# Patient Record
Sex: Male | Born: 1975 | Race: Black or African American | Hispanic: No | Marital: Married | Smoking: Former smoker
Health system: Southern US, Community
[De-identification: ages and names within clinical notes are randomized; demographics above are authoritative.]

## PROBLEM LIST (undated history)

## (undated) DIAGNOSIS — B20 Human immunodeficiency virus [HIV] disease: Secondary | ICD-10-CM

## (undated) DIAGNOSIS — Z9119 Patient's noncompliance with other medical treatment and regimen: Secondary | ICD-10-CM

## (undated) HISTORY — PX: OTHER SURGICAL HISTORY: SHX169

## (undated) HISTORY — DX: Human immunodeficiency virus (HIV) disease: B20

## (undated) HISTORY — DX: Patient's noncompliance with other medical treatment and regimen: Z91.19

---

## 2006-01-08 ENCOUNTER — Ambulatory Visit: Payer: Self-pay | Admitting: Internal Medicine

## 2006-01-08 LAB — CONVERTED CEMR LAB: Hep A Total Ab: POSITIVE

## 2006-01-23 ENCOUNTER — Ambulatory Visit: Payer: Self-pay | Admitting: Internal Medicine

## 2006-01-27 ENCOUNTER — Ambulatory Visit (HOSPITAL_COMMUNITY): Admission: RE | Admit: 2006-01-27 | Discharge: 2006-01-27 | Payer: Self-pay | Admitting: Internal Medicine

## 2006-02-13 ENCOUNTER — Ambulatory Visit: Payer: Self-pay | Admitting: Internal Medicine

## 2006-02-14 ENCOUNTER — Ambulatory Visit: Payer: Self-pay | Admitting: *Deleted

## 2006-02-27 ENCOUNTER — Ambulatory Visit (HOSPITAL_COMMUNITY): Admission: RE | Admit: 2006-02-27 | Discharge: 2006-02-27 | Payer: Self-pay | Admitting: Internal Medicine

## 2006-02-27 ENCOUNTER — Encounter (INDEPENDENT_AMBULATORY_CARE_PROVIDER_SITE_OTHER): Payer: Self-pay | Admitting: Specialist

## 2006-06-16 ENCOUNTER — Ambulatory Visit: Payer: Self-pay | Admitting: Internal Medicine

## 2006-07-07 ENCOUNTER — Ambulatory Visit: Payer: Self-pay | Admitting: Internal Medicine

## 2006-08-28 ENCOUNTER — Ambulatory Visit: Payer: Self-pay | Admitting: Internal Medicine

## 2006-10-02 ENCOUNTER — Ambulatory Visit: Payer: Self-pay | Admitting: Internal Medicine

## 2007-03-05 DIAGNOSIS — B20 Human immunodeficiency virus [HIV] disease: Secondary | ICD-10-CM

## 2007-05-06 ENCOUNTER — Encounter (INDEPENDENT_AMBULATORY_CARE_PROVIDER_SITE_OTHER): Payer: Self-pay | Admitting: *Deleted

## 2007-10-29 ENCOUNTER — Ambulatory Visit: Payer: Self-pay | Admitting: Internal Medicine

## 2007-10-29 LAB — CONVERTED CEMR LAB
ALT: 16 units/L (ref 0–53)
Absolute CD4: 169 #/uL — ABNORMAL LOW (ref 381–1469)
BUN: 10 mg/dL (ref 6–23)
Basophils Relative: 1 % (ref 0–1)
CD4 T Helper %: 14 % — ABNORMAL LOW (ref 32–62)
Calcium: 8.6 mg/dL (ref 8.4–10.5)
Chloride: 105 meq/L (ref 96–112)
Eosinophils Relative: 8 % — ABNORMAL HIGH (ref 0–5)
Hemoglobin: 13.8 g/dL (ref 13.0–17.0)
Lymphocytes Relative: 28 % (ref 12–46)
MCHC: 32.5 g/dL (ref 30.0–36.0)
Monocytes Absolute: 0.5 10*3/uL (ref 0.1–1.0)
Monocytes Relative: 12 % (ref 3–12)
Neutro Abs: 2.3 10*3/uL (ref 1.7–7.7)
Total Bilirubin: 0.2 mg/dL — ABNORMAL LOW (ref 0.3–1.2)
Total Protein: 9.1 g/dL — ABNORMAL HIGH (ref 6.0–8.3)
Total lymphocyte count: 1204 cells/mcL (ref 700–3300)
WBC, lymph enumeration: 4.3 10*3/uL (ref 4.0–10.5)
WBC: 4.3 10*3/uL (ref 4.0–10.5)

## 2008-03-07 ENCOUNTER — Ambulatory Visit: Payer: Self-pay | Admitting: Internal Medicine

## 2008-04-11 ENCOUNTER — Ambulatory Visit: Payer: Self-pay | Admitting: Internal Medicine

## 2008-04-11 LAB — CONVERTED CEMR LAB
ALT: 17 units/L (ref 0–53)
Absolute CD4: 91 #/uL — ABNORMAL LOW (ref 381–1469)
Albumin: 3.9 g/dL (ref 3.5–5.2)
Alkaline Phosphatase: 71 units/L (ref 39–117)
BUN: 14 mg/dL (ref 6–23)
CD4 T Helper %: 14 % — ABNORMAL LOW (ref 32–62)
Calcium: 8.6 mg/dL (ref 8.4–10.5)
Eosinophils Absolute: 0.3 10*3/uL (ref 0.0–0.7)
Eosinophils Relative: 9 % — ABNORMAL HIGH (ref 0–5)
Glucose, Bld: 96 mg/dL (ref 70–99)
Lymphs Abs: 0.7 10*3/uL (ref 0.7–4.0)
MCV: 84 fL (ref 78.0–100.0)
Monocytes Absolute: 0.5 10*3/uL (ref 0.1–1.0)
Monocytes Relative: 17 % — ABNORMAL HIGH (ref 3–12)
Total Bilirubin: 0.3 mg/dL (ref 0.3–1.2)
Total lymphocyte count: 651 cells/mcL — ABNORMAL LOW (ref 700–3300)
WBC, lymph enumeration: 3.1 10*3/uL — ABNORMAL LOW (ref 4.0–10.5)
WBC: 3.1 10*3/uL — ABNORMAL LOW (ref 4.0–10.5)

## 2008-04-14 ENCOUNTER — Encounter: Payer: Self-pay | Admitting: Internal Medicine

## 2008-04-14 LAB — CONVERTED CEMR LAB

## 2008-05-05 ENCOUNTER — Ambulatory Visit: Payer: Self-pay | Admitting: Internal Medicine

## 2008-06-16 ENCOUNTER — Ambulatory Visit: Payer: Self-pay | Admitting: Internal Medicine

## 2008-06-16 LAB — CONVERTED CEMR LAB
AST: 16 units/L (ref 0–37)
Absolute CD4: 99 #/uL — ABNORMAL LOW (ref 381–1469)
Albumin: 4.1 g/dL (ref 3.5–5.2)
BUN: 11 mg/dL (ref 6–23)
Basophils Absolute: 0 10*3/uL (ref 0.0–0.1)
Basophils Relative: 1 % (ref 0–1)
CD4 T Helper %: 10 % — ABNORMAL LOW (ref 32–62)
Calcium: 9.4 mg/dL (ref 8.4–10.5)
Eosinophils Absolute: 0.5 10*3/uL (ref 0.0–0.7)
Eosinophils Relative: 11 % — ABNORMAL HIGH (ref 0–5)
GC Probe Amp, Urine: NEGATIVE
HIV 1 RNA Quant: 6200 copies/mL — ABNORMAL HIGH (ref <50)
Hemoglobin: 13.4 g/dL (ref 13.0–17.0)
Lymphocytes Relative: 23 % (ref 12–46)
MCHC: 33.2 g/dL (ref 30.0–36.0)
Neutro Abs: 2.4 10*3/uL (ref 1.7–7.7)
Potassium: 4 meq/L (ref 3.5–5.3)
RBC: 4.52 M/uL (ref 4.22–5.81)
Sodium: 137 meq/L (ref 135–145)
Total Bilirubin: 0.5 mg/dL (ref 0.3–1.2)

## 2008-07-07 ENCOUNTER — Ambulatory Visit: Payer: Self-pay | Admitting: Internal Medicine

## 2008-08-04 ENCOUNTER — Encounter: Payer: Self-pay | Admitting: Internal Medicine

## 2008-08-04 ENCOUNTER — Ambulatory Visit: Payer: Self-pay | Admitting: Internal Medicine

## 2008-08-04 LAB — CONVERTED CEMR LAB
ALT: 13 units/L (ref 0–53)
AST: 18 units/L (ref 0–37)
Absolute CD4: 80 #/uL — ABNORMAL LOW (ref 381–1469)
Alkaline Phosphatase: 66 units/L (ref 39–117)
CD4 T Helper %: 12 % — ABNORMAL LOW (ref 32–62)
CO2: 24 meq/L (ref 19–32)
Calcium: 9.3 mg/dL (ref 8.4–10.5)
Glucose, Bld: 95 mg/dL (ref 70–99)
HCT: 40.8 % (ref 39.0–52.0)
HIV 1 RNA Quant: 4950 copies/mL — ABNORMAL HIGH (ref <48)
Lymphs Abs: 0.7 10*3/uL (ref 0.7–4.0)
MCV: 89.1 fL (ref 78.0–100.0)
Monocytes Absolute: 0.4 10*3/uL (ref 0.1–1.0)
Platelets: 203 10*3/uL (ref 150–400)
RBC: 4.58 M/uL (ref 4.22–5.81)
Sodium: 137 meq/L (ref 135–145)
Total Bilirubin: 0.4 mg/dL (ref 0.3–1.2)
WBC, lymph enumeration: 3.9 10*3/uL — ABNORMAL LOW (ref 4.0–10.5)
WBC: 3.9 10*3/uL — ABNORMAL LOW (ref 4.0–10.5)

## 2008-09-08 ENCOUNTER — Ambulatory Visit: Payer: Self-pay | Admitting: Internal Medicine

## 2008-09-09 ENCOUNTER — Encounter: Payer: Self-pay | Admitting: Internal Medicine

## 2008-09-09 LAB — CONVERTED CEMR LAB
CD4 T Helper %: 15 % — ABNORMAL LOW (ref 32–62)
HIV-1 RNA Quant, Log: 4.79 — ABNORMAL HIGH (ref <1.68)
WBC, lymph enumeration: 3.3 10*3/uL — ABNORMAL LOW (ref 4.0–10.5)

## 2008-09-26 ENCOUNTER — Encounter: Payer: Self-pay | Admitting: Internal Medicine

## 2008-09-26 ENCOUNTER — Ambulatory Visit: Payer: Self-pay | Admitting: Internal Medicine

## 2008-11-07 ENCOUNTER — Ambulatory Visit: Payer: Self-pay | Admitting: Internal Medicine

## 2008-11-07 LAB — CONVERTED CEMR LAB
ALT: 31 units/L (ref 0–53)
AST: 112 units/L — ABNORMAL HIGH (ref 0–37)
Albumin: 4 g/dL (ref 3.5–5.2)
BUN: 14 mg/dL (ref 6–23)
Basophils Absolute: 0 10*3/uL (ref 0.0–0.1)
CD4 T Helper %: 13 % — ABNORMAL LOW (ref 32–62)
CO2: 23 meq/L (ref 19–32)
Calcium: 9.2 mg/dL (ref 8.4–10.5)
Eosinophils Relative: 8 % — ABNORMAL HIGH (ref 0–5)
Glucose, Bld: 86 mg/dL (ref 70–99)
HCT: 37.6 % — ABNORMAL LOW (ref 39.0–52.0)
HIV-1 RNA Quant, Log: 2.32 — ABNORMAL HIGH (ref <1.68)
Lymphocytes Relative: 19 % (ref 12–46)
MCV: 86.4 fL (ref 78.0–100.0)
Monocytes Absolute: 0.4 10*3/uL (ref 0.1–1.0)
Neutrophils Relative %: 65 % (ref 43–77)
Platelets: 198 10*3/uL (ref 150–400)
Potassium: 3.8 meq/L (ref 3.5–5.3)
Total Protein: 8 g/dL (ref 6.0–8.3)
WBC: 4.6 10*3/uL (ref 4.0–10.5)

## 2008-11-21 ENCOUNTER — Encounter: Payer: Self-pay | Admitting: Internal Medicine

## 2008-11-21 ENCOUNTER — Ambulatory Visit: Payer: Self-pay | Admitting: Internal Medicine

## 2008-11-25 ENCOUNTER — Telehealth: Payer: Self-pay | Admitting: Internal Medicine

## 2008-12-06 ENCOUNTER — Encounter: Payer: Self-pay | Admitting: Internal Medicine

## 2008-12-23 ENCOUNTER — Telehealth: Payer: Self-pay | Admitting: Internal Medicine

## 2009-03-30 ENCOUNTER — Encounter: Payer: Self-pay | Admitting: Internal Medicine

## 2009-03-30 ENCOUNTER — Ambulatory Visit: Payer: Self-pay | Admitting: Internal Medicine

## 2009-03-30 LAB — CONVERTED CEMR LAB
ALT: 10 units/L (ref 0–53)
AST: 21 units/L (ref 0–37)
Absolute CD4: 141 #/uL — ABNORMAL LOW (ref 381–1469)
Albumin: 4.5 g/dL (ref 3.5–5.2)
BUN: 10 mg/dL (ref 6–23)
Basophils Absolute: 0 10*3/uL (ref 0.0–0.1)
Basophils Relative: 1 % (ref 0–1)
CD4 T Helper %: 13 % — ABNORMAL LOW (ref 32–62)
Eosinophils Relative: 10 % — ABNORMAL HIGH (ref 0–5)
Glucose, Bld: 81 mg/dL (ref 70–99)
HCT: 40.8 % (ref 39.0–52.0)
HIV 1 RNA Quant: <48 copies/mL (ref <48)
Lymphs Abs: 1.1 10*3/uL (ref 0.7–4.0)
MCHC: 33.3 g/dL (ref 30.0–36.0)
Monocytes Absolute: 0.4 10*3/uL (ref 0.1–1.0)
Neutrophils Relative %: 59 % (ref 43–77)
Potassium: 3.7 meq/L (ref 3.5–5.3)
RBC: 4.69 M/uL (ref 4.22–5.81)
RDW: 12.4 % (ref 11.5–15.5)
Sodium: 136 meq/L (ref 135–145)
Total Protein: 8 g/dL (ref 6.0–8.3)
Total lymphocyte count: 1081 cells/mcL (ref 700–3300)

## 2009-04-17 ENCOUNTER — Telehealth: Payer: Self-pay | Admitting: Internal Medicine

## 2009-04-20 ENCOUNTER — Encounter: Payer: Self-pay | Admitting: Internal Medicine

## 2009-04-20 ENCOUNTER — Ambulatory Visit: Payer: Self-pay | Admitting: Internal Medicine

## 2009-05-16 ENCOUNTER — Telehealth: Payer: Self-pay | Admitting: Internal Medicine

## 2009-07-24 ENCOUNTER — Encounter: Payer: Self-pay | Admitting: Internal Medicine

## 2009-07-24 ENCOUNTER — Ambulatory Visit: Payer: Self-pay | Admitting: Internal Medicine

## 2009-07-24 LAB — CONVERTED CEMR LAB
Absolute CD4: 173 #/uL — ABNORMAL LOW (ref 381–1469)
Albumin: 4.4 g/dL (ref 3.5–5.2)
Alkaline Phosphatase: 61 units/L (ref 39–117)
CO2: 24 meq/L (ref 19–32)
HIV 1 RNA Quant: 66 copies/mL — ABNORMAL HIGH (ref <48)
Hemoglobin: 13.1 g/dL (ref 13.0–17.0)
MCHC: 33.3 g/dL (ref 30.0–36.0)
Neutro Abs: 2.3 10*3/uL (ref 1.7–7.7)
Neutrophils Relative %: 61 % (ref 43–77)
Platelets: 256 10*3/uL (ref 150–400)
Sodium: 139 meq/L (ref 135–145)
Total lymphocyte count: 962 cells/mcL (ref 700–3300)

## 2009-08-24 ENCOUNTER — Encounter: Payer: Self-pay | Admitting: Internal Medicine

## 2009-08-24 ENCOUNTER — Ambulatory Visit: Payer: Self-pay | Admitting: Internal Medicine

## 2009-11-02 ENCOUNTER — Telehealth: Payer: Self-pay | Admitting: Internal Medicine

## 2009-11-15 ENCOUNTER — Telehealth: Payer: Self-pay | Admitting: Internal Medicine

## 2009-11-23 ENCOUNTER — Ambulatory Visit: Payer: Self-pay | Admitting: Internal Medicine

## 2009-11-23 LAB — CONVERTED CEMR LAB
ALT: 12 U/L
AST: 18 U/L
Albumin: 4.1 g/dL
Alkaline Phosphatase: 59 U/L
BUN: 4 mg/dL — ABNORMAL LOW
Basophils Absolute: 0 10*3/uL
Basophils Relative: 1 %
CO2: 22 meq/L
Calcium: 8.5 mg/dL
Chloride: 106 meq/L
Cholesterol: 119 mg/dL
Creatinine, Ser: 0.83 mg/dL
Eosinophils Absolute: 0.4 10*3/uL
Eosinophils Relative: 5 %
Glucose, Bld: 81 mg/dL
HCT: 35.9 % — ABNORMAL LOW
HDL: 34 mg/dL — ABNORMAL LOW
HIV 1 RNA Quant: 86 {copies}/mL — ABNORMAL HIGH
HIV-1 RNA Quant, Log: 1.93 — ABNORMAL HIGH
Hemoglobin: 11.9 g/dL — ABNORMAL LOW
LDL Cholesterol: 75 mg/dL
Lymphocytes Relative: 18 %
Lymphs Abs: 1.3 10*3/uL
MCHC: 33.1 g/dL
MCV: 88 fL
Monocytes Absolute: 0.4 10*3/uL
Monocytes Relative: 5 %
Neutro Abs: 5 10*3/uL
Neutrophils Relative %: 71 %
Platelets: 230 10*3/uL
Potassium: 3.7 meq/L
RBC: 4.08 M/uL — ABNORMAL LOW
RDW: 11.8 %
Sodium: 139 meq/L
Total Bilirubin: 0.5 mg/dL
Total CHOL/HDL Ratio: 3.5
Total Protein: 7.1 g/dL
Triglycerides: 50 mg/dL
VLDL: 10 mg/dL
WBC: 7 10*3/uL

## 2009-11-30 ENCOUNTER — Telehealth (INDEPENDENT_AMBULATORY_CARE_PROVIDER_SITE_OTHER): Payer: Self-pay | Admitting: *Deleted

## 2009-12-22 ENCOUNTER — Telehealth: Payer: Self-pay | Admitting: Internal Medicine

## 2010-03-14 ENCOUNTER — Encounter: Payer: Self-pay | Admitting: Internal Medicine

## 2010-04-18 ENCOUNTER — Encounter: Payer: Self-pay | Admitting: Internal Medicine

## 2010-04-26 ENCOUNTER — Ambulatory Visit: Payer: Self-pay | Admitting: Internal Medicine

## 2010-04-26 LAB — CONVERTED CEMR LAB
AST: 18 units/L (ref 0–37)
Alkaline Phosphatase: 59 units/L (ref 39–117)
Basophils Absolute: 0 10*3/uL (ref 0.0–0.1)
Creatinine, Ser: 1.05 mg/dL (ref 0.40–1.50)
Eosinophils Relative: 4 % (ref 0–5)
Glucose, Bld: 98 mg/dL (ref 70–99)
HIV 1 RNA Quant: 86 copies/mL — ABNORMAL HIGH (ref <48)
HIV-1 RNA Quant, Log: 1.93 — ABNORMAL HIGH (ref <1.68)
Hemoglobin: 12 g/dL — ABNORMAL LOW (ref 13.0–17.0)
Lymphs Abs: 1.3 10*3/uL (ref 0.7–4.0)
Neutro Abs: 3.2 10*3/uL (ref 1.7–7.7)
Platelets: 190 10*3/uL (ref 150–400)
RDW: 11.9 % (ref 11.5–15.5)
WBC: 5 10*3/uL (ref 4.0–10.5)

## 2010-05-10 ENCOUNTER — Ambulatory Visit: Payer: Self-pay | Admitting: Internal Medicine

## 2010-07-09 ENCOUNTER — Encounter (INDEPENDENT_AMBULATORY_CARE_PROVIDER_SITE_OTHER): Payer: Self-pay | Admitting: *Deleted

## 2010-09-13 ENCOUNTER — Ambulatory Visit
Admission: RE | Admit: 2010-09-13 | Discharge: 2010-09-13 | Payer: Self-pay | Source: Home / Self Care | Attending: Adult Health | Admitting: Adult Health

## 2010-09-13 ENCOUNTER — Encounter: Payer: Self-pay | Admitting: Adult Health

## 2010-09-13 ENCOUNTER — Encounter: Payer: Self-pay | Admitting: Internal Medicine

## 2010-09-13 LAB — CONVERTED CEMR LAB
Albumin: 4.2 g/dL (ref 3.5–5.2)
Alkaline Phosphatase: 61 units/L (ref 39–117)
BUN: 10 mg/dL (ref 6–23)
Basophils Relative: 0 % (ref 0–1)
CO2: 24 meq/L (ref 19–32)
Chloride: 103 meq/L (ref 96–112)
Creatinine, Ser: 1 mg/dL (ref 0.40–1.50)
Eosinophils Absolute: 0.2 10*3/uL (ref 0.0–0.7)
HIV-1 RNA Quant, Log: 1.46 — ABNORMAL HIGH (ref <1.30)
Hemoglobin: 13.4 g/dL (ref 13.0–17.0)
MCV: 86.6 fL (ref 78.0–100.0)
Neutrophils Relative %: 60 % (ref 43–77)
Platelets: 201 10*3/uL (ref 150–400)
Potassium: 3.7 meq/L (ref 3.5–5.3)
RBC: 4.47 M/uL (ref 4.22–5.81)
RDW: 11.7 % (ref 11.5–15.5)
Total Bilirubin: 0.5 mg/dL (ref 0.3–1.2)
Total Protein: 7.6 g/dL (ref 6.0–8.3)
WBC: 5.2 10*3/uL (ref 4.0–10.5)

## 2010-09-14 LAB — T-HELPER CELL (CD4) - (RCID CLINIC ONLY)
CD4 % Helper T Cell: 17 % — ABNORMAL LOW (ref 33–55)
CD4 T Cell Abs: 290 uL — ABNORMAL LOW (ref 400–2700)

## 2010-09-17 ENCOUNTER — Encounter (INDEPENDENT_AMBULATORY_CARE_PROVIDER_SITE_OTHER): Payer: Self-pay | Admitting: *Deleted

## 2010-09-18 NOTE — Progress Notes (Signed)
Summary: [facility]AD[employer]/pt assist meds arrived for May  Phone Note Refill Request      Prescriptions: NORVIR 100 MG TABS (RITONAVIR) Take 1 tablet by mouth once a day  #30 x 0   Entered by:   Paulo Fruit  BS,CPht II,MPH   Authorized by:   Yisroel Ramming MD   Signed by:   Paulo Fruit  BS,CPht II,MPH on 12/22/2009   Method used:   Samples Given   RxID:   [id number] TRUVADA 200-300 MG TABS (EMTRICITABINE-TENOFOVIR) ONE by mouth DAILY  #30 x 0   Entered by:   Paulo Fruit  BS,CPht II,MPH   Authorized by:   Yisroel Ramming MD   Signed by:   Paulo Fruit  BS,CPht II,MPH on 12/22/2009   Method used:   Samples Given   RxID:   [id number] PREZISTA 400 MG TABS (DARUNAVIR ETHANOLATE) TWO PILLS by mouth DAILY  #60 x 0   Entered by:   Paulo Fruit  BS,CPht II,MPH   Authorized by:   Yisroel Ramming MD   Signed by:   Paulo Fruit  BS,CPht II,MPH on 12/22/2009   Method used:   Samples Given   RxID:   [id number] BACTRIM DS 800-160 MG  TABS (SULFAMETHOXAZOLE-TRIMETHOPRIM) Take 1 tablet by mouth one time a day  #30 x 0   Entered by:   Paulo Fruit  BS,CPht II,MPH   Authorized by:   Yisroel Ramming MD   Signed by:   Paulo Fruit  BS,CPht II,MPH on 12/22/2009   Method used:   Samples Given   RxID:   [id number]  Patient Assist Medication Verification: Medication name: SMZ-TMP DS 800/160mg  RX #  [id number] Tech approval:MLD   Patient Assist Medication Verification: Medication:Norvir 100mg  Lot# [id number] Exp Date:20 Apr 2011 Tech approval:MLD                Patient Assist Medication Verification: Medication:Prezista 400mg  Lot# [id number] Exp Date:12 2012 Tech approval:MLD                Patient Assist Medication Verification: Medication:Truvada Lot# [id number] Exp Date:09 2014 Tech approval:MLD Call placed to patient with message that assistance medications are ready for pick-up. Left message on VM for patient to contact [facility] @ [phone] Paulo Fruit  BS,CPht II,MPH  Dec 22, 2009  4:04 PM                   Appended Document: [employer]/pt assist meds arrived for May Prescription/Samples picked up by: patient

## 2010-09-18 NOTE — Miscellaneous (Signed)
Summary: HIPAA: Restrictions  HIPAA: Restrictions   Imported By: Florinda Marker 11/27/2009 15:00:11  _____________________________________________________________________  External Attachment:    Type:   Image     Comment:   External Document

## 2010-09-18 NOTE — Miscellaneous (Signed)
  Clinical Lists Changes  Observations: Added new observation of YEARAIDSPOS: 2007  (07/09/2010 10:45)

## 2010-09-18 NOTE — Assessment & Plan Note (Signed)
Summary: pt. from hse f/u [mkj]   CC:  follow-up visit and right shoulder pain.  History of Present Illness: Pt doing well - no missed doses of his HIV meds. No new problems.  Preventive Screening-Counseling & Management  Alcohol-Tobacco     Alcohol drinks/day: 0     Smoking Status: current     Smoking Cessation Counseling: yes     Packs/Day: <0.25  Caffeine-Diet-Exercise     Caffeine use/day: 1     Does Patient Exercise: yes     Type of exercise: soccer,runs     Exercise (avg: min/session): >60     Times/week: 4  Hep-HIV-STD-Contraception     HIV Risk: no     Sun Exposure-Excessive: occasionally  Safety-Violence-Falls     Seat Belt Use: 100      Sexual History:  currently monogamous.        Drug Use:  yes.     Updated Prior Medication List: BACTRIM DS 800-160 MG  TABS (SULFAMETHOXAZOLE-TRIMETHOPRIM) Take 1 tablet by mouth one time a day PREZISTA 400 MG TABS (DARUNAVIR ETHANOLATE) TWO PILLS by mouth DAILY TRUVADA 200-300 MG TABS (EMTRICITABINE-TENOFOVIR) ONE by mouth DAILY NORVIR 100 MG TABS (RITONAVIR) Take 1 tablet by mouth once a day  Current Allergies (reviewed today): No known allergies  Social History: Sexual History:  currently monogamous  Review of Systems  The patient denies anorexia, fever, and weight loss.    Vital Signs:  Patient profile:   35 year old male Height:      68.5 inches (173.99 cm) Weight:      139.8 pounds (63.55 kg) BMI:     21.02 Pulse rate:   67 / minute BP sitting:   107 / 71  (right arm)  Vitals Entered By: Wendall Mola CMA Duncan Dull) (November 23, 2009 10:40 AM) CC: follow-up visit, right shoulder pain Is Patient Diabetic? No Pain Assessment Patient in pain? yes     Location: shoulder Intensity: 3 Type: dull Onset of pain  Intermittent Nutritional Status BMI of 19 -24 = normal Nutritional Status Detail appetite "good"  Does patient need assistance? Functional Status Self care Ambulation Normal Comments no  missed doses of meds per patient   Physical Exam  General:  alert, well-developed, well-nourished, and well-hydrated.   Head:  normocephalic and atraumatic.   Mouth:  pharynx pink and moist.   Lungs:  normal breath sounds.      Impression & Recommendations:  Problem # 1:  HIV DISEASE (ICD-042) Will obtain labs today and have pt f/u in 2 weeks for results.  He is to continue his curent meds. Diagnostics Reviewed:  HIV: CDC-defined AIDS (11/21/2008)   CD4: 94 (09/08/2008)   WBC: 3.7 (07/24/2009)   Hgb: 13.1 (07/24/2009)   HCT: 39.3 (07/24/2009)   Platelets: 256 (07/24/2009) HIV genotype: See Comment (09/09/2008)   HIV-1 RNA: 66 (07/24/2009)   HBSAg: negative (01/08/2006)  Other Orders: T-CD4SP ([facility]) (CD4SP) T-HIV Viral Load [phone]) T-Comprehensive Metabolic Panel [phone]) T-Lipid Profile (13244-01027) T-CBC w/Diff ([id number]) T-RPR (Syphilis) (74259-56387) Est. Patient Level III (56433)  Patient Instructions: 1)  Please schedule a follow-up appointment in 2 weeks.

## 2010-09-18 NOTE — Progress Notes (Signed)
Summary: NCADAP/pt assist meds arrived for Apr  Phone Note Refill Request      Prescriptions: NORVIR 100 MG TABS (RITONAVIR) Take 1 tablet by mouth once a day  #30 x 0   Entered by:   Paulo Fruit  BS,CPht II,MPH   Authorized by:   Yisroel Ramming MD   Signed by:   Paulo Fruit  BS,CPht II,MPH on 11/30/2009   Method used:   Samples Given   RxID:   [id number] TRUVADA 200-300 MG TABS (EMTRICITABINE-TENOFOVIR) ONE by mouth DAILY  #30 x 0   Entered by:   Paulo Fruit  BS,CPht II,MPH   Authorized by:   Yisroel Ramming MD   Signed by:   Paulo Fruit  BS,CPht II,MPH on 11/30/2009   Method used:   Samples Given   RxID:   [id number] PREZISTA 400 MG TABS (DARUNAVIR ETHANOLATE) TWO PILLS by mouth DAILY  #60 x 0   Entered by:   Paulo Fruit  BS,CPht II,MPH   Authorized by:   Yisroel Ramming MD   Signed by:   Paulo Fruit  BS,CPht II,MPH on 11/30/2009   Method used:   Samples Given   RxID:   [id number] BACTRIM DS 800-160 MG  TABS (SULFAMETHOXAZOLE-TRIMETHOPRIM) Take 1 tablet by mouth one time a day  #30 x 0   Entered by:   Paulo Fruit  BS,CPht II,MPH   Authorized by:   Yisroel Ramming MD   Signed by:   Paulo Fruit  BS,CPht II,MPH on 11/30/2009   Method used:   Samples Given   RxID:   [id number]  Patient Assist Medication Verification: Medication name: SMZ-TMP DS 800/160mg  RX # [id number] Tech approval:MLD   Patient Assist Medication Verification: Medication:Truvada Lot# [id number] Exp Date:06 2014 Tech approval:MLD                Patient Assist Medication Verification: Medication:Norvir 100mg  Lot# [id number] Exp Date:20 Apr 2011 Tech approval:MLD                Patient Assist Medication Verification: Medication:Prezista 400mg  Lot# [id number] Exp Date:12 2012 Tech approval:MLD Call placed to patient with message that assistance medications are ready for pick-up. Left message on VM for patient to call [facility] @ [phone] Paulo Fruit  BS,CPht II,MPH  November 30, 2009  12:05 PM

## 2010-09-18 NOTE — Miscellaneous (Signed)
Summary: Orders Update  Clinical Lists Changes  Orders: Added new Test order of T-CBC w/Diff [phone]) - Signed Added new Test order of T-CD4SP [facility]) (CD4SP) - Signed Added new Test order of T-Comprehensive Metabolic Panel [phone]) - Signed Added new Test order of T-HIV Viral Load [phone]) - Signed     Process Orders Check Orders Results:     [facility]: ABN not required for this insurance Tests Sent for requisitioning (April 18, 2010 11:40 AM):     04/18/2010: [facility] -- T-CBC w/Diff [[id number]] (signed)     04/18/2010: [facility] -- T-Comprehensive Metabolic Panel [[id number]] (signed)     04/18/2010: [facility] -- T-HIV Viral Load [phone] (signed)

## 2010-09-18 NOTE — Assessment & Plan Note (Signed)
Summary: CHECK UP [MKJ]   CC:  follow-up visit and lab results.  History of Present Illness: Pt here for f/u. No new problems. No missed doses of his HIV meds.  Preventive Screening-Counseling & Management  Alcohol-Tobacco     Alcohol drinks/day: 0     Smoking Status: current     Smoking Cessation Counseling: yes     Packs/Day: <0.25  Caffeine-Diet-Exercise     Caffeine use/day: 1     Does Patient Exercise: yes     Type of exercise: soccer,runs     Exercise (avg: min/session): >60     Times/week: 4  Hep-HIV-STD-Contraception     HIV Risk: no     Sun Exposure-Excessive: occasionally  Safety-Violence-Falls     Seat Belt Use: 100      Sexual History:  currently monogamous.        Drug Use:  yes.    Comments: pt. given condoms   Updated Prior Medication List: BACTRIM DS 800-160 MG  TABS (SULFAMETHOXAZOLE-TRIMETHOPRIM) Take 1 tablet by mouth one time a day PREZISTA 400 MG TABS (DARUNAVIR ETHANOLATE) TWO PILLS by mouth DAILY TRUVADA 200-300 MG TABS (EMTRICITABINE-TENOFOVIR) ONE by mouth DAILY NORVIR 100 MG TABS (RITONAVIR) Take 1 tablet by mouth once a day  Current Allergies (reviewed today): No known allergies  Past History:  Past Medical History: Last updated: 03/05/2007 HIV disease  Review of Systems  The patient denies anorexia, fever, and weight loss.    Vital Signs:  Patient profile:   35 year old male Height:      68.5 inches (173.99 cm) Weight:      145.0 pounds (65.91 kg) BMI:     21.81 Temp:     98.5 degrees F (36.94 degrees C) oral Pulse rate:   60 / minute BP sitting:   102 / 55  (right arm)  Vitals Entered By: Wendall Mola CMA Duncan Dull) (May 10, 2010 2:21 PM) CC: follow-up visit, lab results Is Patient Diabetic? No Pain Assessment Patient in pain? no      Nutritional Status BMI of 19 -24 = normal Nutritional Status Detail appetite "good"  Does patient need assistance? Functional Status Self care Ambulation  Normal Comments no missed doses of meds per pt.   Physical Exam  General:  alert, well-developed, well-nourished, and well-hydrated.   Head:  normocephalic and atraumatic.   Mouth:  pharynx pink and moist.   Lungs:  normal breath sounds.     Impression & Recommendations:  Problem # 1:  HIV DISEASE (ICD-042) Pt.s most recent CD4ct was 210 and VL  86.  Pt instructed to continue the current antiretroviral regimen.  Pt encouraged to take medication regularly and not miss doses.  Pt will f/u in 3 months for repeat blood work and will see me 2 weeks later. Influenza vaccine given.  Diagnostics Reviewed:  HIV: CDC-defined AIDS (11/21/2008)   CD4: 210 (04/27/2010)   WBC: 5.0 (04/26/2010)   Hgb: 12.0 (04/26/2010)   HCT: 35.5 (04/26/2010)   Platelets: 190 (04/26/2010) HIV genotype: See Comment (09/09/2008)   HIV-1 RNA: 86 (04/26/2010)   HBSAg: negative (01/08/2006)  Other Orders: Est. Patient Level III (65784) Influenza Vaccine NON MCR (69629) Future Orders: T-CD4SP ([facility]) (CD4SP) ... 08/08/2010 T-HIV Viral Load [phone]) ... 08/08/2010 T-Comprehensive Metabolic Panel [phone]) ... 08/08/2010 T-CBC w/Diff ([id number]) ... 08/08/2010  Patient Instructions: 1)  Please schedule a follow-up appointment in 3 months, 2 weeks after labs.      Immunizations Administered:  Influenza Vaccine # 1:  Vaccine Type: Fluvax Non-MCR    Site: right deltoid    Mfr: Novartis    Dose: 0.5 ml    Route: IM    Given by: Wendall Mola CMA ( AAMA)    Exp. Date: 11/18/2010    Lot #: [id number]    VIS given: 03/12/07 version given May 10, 2010.  Flu Vaccine Consent Questions:    Do you have a history of severe allergic reactions to this vaccine? no    Any prior history of allergic reactions to egg and/or gelatin? no    Do you have a sensitivity to the preservative Thimersol? no    Do you have a past history of Guillan-Barre Syndrome? no    Do you currently have an acute  febrile illness? no    Have you ever had a severe reaction to latex? no    Vaccine information given and explained to patient? yes

## 2010-09-18 NOTE — Progress Notes (Signed)
Summary: med refill  Phone Note Refill Request   Refills Requested: Medication #1:  NORVIR 100 MG TABS Take 1 tablet by mouth once a day.   Dosage confirmed as above?Dosage Confirmed   Supply Requested: 6 months   Last Refilled: 10/04/2009  Medication #2:  PREZISTA 400 MG TABS TWO PILLS by mouth DAILY   Dosage confirmed as above?Dosage Confirmed   Supply Requested: 6 months   Last Refilled: 10/04/2009  Medication #3:  TRUVADA 200-300 MG TABS ONE by mouth DAILY   Dosage confirmed as above?Dosage Confirmed   Supply Requested: 6 months   Last Refilled: 10/04/2009  refill for Trimetho/sulfamethox not in computer but approved for 6 mo. please add   Initial call taken by: Gaylyn Cheers RN,  November 02, 2009 12:47 PM  Follow-up for Phone Call        ok Follow-up by: Yisroel Ramming MD,  November 07, 2009 3:05 PM

## 2010-09-18 NOTE — Progress Notes (Signed)
Summary: med refills  Phone Note Refill Request   Refills Requested: Medication #1:  BACTRIM DS 800-160 MG  TABS Take 1 tablet by mouth one time a day   Dosage confirmed as above?Dosage Confirmed   Brand Name Necessary? No   Supply Requested: 1 month  Medication #2:  PREZISTA 400 MG TABS TWO PILLS by mouth DAILY   Dosage confirmed as above?Dosage Confirmed   Brand Name Necessary? No   Supply Requested: 1 month  Medication #3:  TRUVADA 200-300 MG TABS ONE by mouth DAILY   Dosage confirmed as above?Dosage Confirmed   Brand Name Necessary? No   Supply Requested: 1 month  Medication #4:  NORVIR 100 MG TABS Take 1 tablet by mouth once a day.   Dosage confirmed as above?Dosage Confirmed   Brand Name Necessary? No   Supply Requested: 1 month Med refills requested in Feb. but does not appear they were called to the pharmacy.   Method Requested: Telephone to Pharmacy Next Appointment Scheduled: 11/20/09 Initial call taken by: Sharen Heck RN,  November 15, 2009 2:20 PM    Prescriptions: NORVIR 100 MG TABS (RITONAVIR) Take 1 tablet by mouth once a day  #30 x 5   Entered by:   Sharen Heck RN   Authorized by:   Yisroel Ramming MD   Signed by:   Yisroel Ramming MD on 11/16/2009   Method used:   Telephoned to ...       [facility] ([facility]order[facility]       [address]       [place], [place]  [address]       Ph: [phone]       Fax: [phone]   RxID:   [id number] TRUVADA 200-300 MG TABS (EMTRICITABINE-TENOFOVIR) ONE by mouth DAILY  #30 x 5   Entered by:   Sharen Heck RN   Authorized by:   Yisroel Ramming MD   Signed by:   Yisroel Ramming MD on 11/16/2009   Method used:   Telephoned to ...       [facility] ([facility]       [address]       [place], [place]  [address]       Ph: [phone]       Fax: [phone]   RxID:   [id number] PREZISTA 400 MG TABS (DARUNAVIR ETHANOLATE) TWO PILLS by mouth DAILY  #60 x 5   Entered by:   Sharen Heck RN   Authorized by:   Yisroel Ramming MD   Signed by:   Yisroel Ramming MD on 11/16/2009   Method used:   Telephoned to ...       [facility] ([facility]       [address]       [place], [place]  [address]       Ph: [phone]       Fax: [phone]   RxID:   [id number] BACTRIM DS 800-160 MG  TABS (SULFAMETHOXAZOLE-TRIMETHOPRIM) Take 1 tablet by mouth one time a day  #30 x 5   Entered by:   Sharen Heck RN   Authorized by:   Yisroel Ramming MD   Signed by:   Yisroel Ramming MD on 11/16/2009   Method used:   Telephoned to ...       [facility]       [address]       [place], [place]  [address]  Ph: [phone]       Fax: [phone]   RxID:   [id number]

## 2010-09-18 NOTE — Miscellaneous (Signed)
Summary: Office Visit ([facility])    Vital Signs:  Patient profile:   35 year old male Weight:      146.5 pounds Temp:     99.1 degrees F oral Pulse rate:   68 / minute Pulse rhythm:   regular Resp:     17 per minute BP sitting:   116 / 78  (left arm)  Vitals Entered By: Sharen Heck RN (August 24, 2009 2:24 PM) CC: f/u 05 Pain Assessment Patient in pain? no       Does patient need assistance? Functional Status Self care Ambulation Normal   CC:  f/u 05.  History of Present Illness: Pt here for f/u.  He has been feeling well. He has not missed any doses of his HIV meds.  Current Problems (verified): 1)  HIV Disease  (ICD-042)  Current Medications (verified): 1)  Bactrim Ds 800-160 Mg  Tabs (Sulfamethoxazole-Trimethoprim) .... Take 1 Tablet By Mouth One Time A Day 2)  Prezista 400 Mg Tabs (Darunavir Ethanolate) .... Two Pills By Mouth Daily 3)  Truvada 200-300 Mg Tabs (Emtricitabine-Tenofovir) .... One By Mouth Daily 4)  Norvir 100 Mg Tabs (Ritonavir) .... Take 1 Tablet By Mouth Once A Day  Allergies (verified): No Known Drug Allergies   Review of Systems  The patient denies anorexia, fever, and weight loss.     Physical Exam  General:  alert, well-developed, well-nourished, and well-hydrated.   Head:  normocephalic and atraumatic.     Impression & Recommendations:  Problem # 1:  HIV DISEASE (ICD-042) Pt to continue his current regimen and try not to miss any doses. His updated medication list for this problem includes:    Bactrim Ds 800-160 Mg Tabs (Sulfamethoxazole-trimethoprim) .Marland Kitchen... Take 1 tablet by mouth one time a day  Diagnostics Reviewed:  HIV: CDC-defined AIDS (11/21/2008)   CD4: 94 (09/08/2008)   WBC: 3.7 (07/24/2009)   Hgb: 13.1 (07/24/2009)   HCT: 39.3 (07/24/2009)   Platelets: 256 (07/24/2009) HIV genotype: See Comment (09/09/2008)   HIV-1 RNA: 66 (07/24/2009)   HBSAg: negative (01/08/2006)   Patient Instructions: 1)  Please  schedule a follow-up appointment in 3 months.

## 2010-09-18 NOTE — Miscellaneous (Signed)
Summary: RW Update  Clinical Lists Changes  Observations: Added new observation of DATE1STVISIT: 11/23/2009 (03/14/2010 12:18) Added new observation of RWPARTICIP: Yes (03/14/2010 12:18)

## 2010-09-26 ENCOUNTER — Encounter (INDEPENDENT_AMBULATORY_CARE_PROVIDER_SITE_OTHER): Payer: Self-pay | Admitting: *Deleted

## 2010-09-26 NOTE — Miscellaneous (Signed)
  Clinical Lists Changes  Observations: Added new observation of INCOMESOURCE: UNKNOWN (09/17/2010 15:20) Added new observation of #CHILD<18 IN: Unknown (09/17/2010 15:20) Added new observation of YEARLYEXPEN: 0  (09/17/2010 15:20)

## 2010-09-28 ENCOUNTER — Ambulatory Visit: Payer: Self-pay | Admitting: Adult Health

## 2010-10-03 ENCOUNTER — Telehealth (INDEPENDENT_AMBULATORY_CARE_PROVIDER_SITE_OTHER): Payer: Self-pay | Admitting: *Deleted

## 2010-10-04 NOTE — Miscellaneous (Signed)
  Clinical Lists Changes 

## 2010-10-10 NOTE — Progress Notes (Signed)
  Phone Note Other Incoming   Summary of Call: ADAP submitted/RW reauthorization filed

## 2010-10-17 ENCOUNTER — Telehealth (INDEPENDENT_AMBULATORY_CARE_PROVIDER_SITE_OTHER): Payer: Self-pay | Admitting: *Deleted

## 2010-10-17 ENCOUNTER — Encounter (INDEPENDENT_AMBULATORY_CARE_PROVIDER_SITE_OTHER): Payer: Self-pay | Admitting: *Deleted

## 2010-10-25 NOTE — Miscellaneous (Signed)
Summary: ADAP/RW update   Clinical Lists Changes  Observations: Added new observation of AIDSDAP: Yes 2012 (10/17/2010 11:08) Added new observation of PCTFPL: 71.67  (10/17/2010 11:08) Added new observation of HOUSEINCOME: 11914  (10/17/2010 11:08) Added new observation of #CHILD<18 IN: Yes  (10/17/2010 11:08) Added new observation of FINASSESSDT: 09/13/2010  (10/17/2010 11:08)

## 2010-10-25 NOTE — Progress Notes (Signed)
  Phone Note Other Incoming   Summary of Call: ADAP approval received Initial call taken by: Kathrin Ruddy,  October 17, 2010 10:46 AM

## 2010-11-01 LAB — T-HELPER CELL (CD4) - (RCID CLINIC ONLY): CD4 % Helper T Cell: 16 % — ABNORMAL LOW (ref 33–55)

## 2010-11-07 LAB — T-HELPER CELL (CD4) - (RCID CLINIC ONLY)
CD4 % Helper T Cell: 17 % — ABNORMAL LOW (ref 33–55)
CD4 T Cell Abs: 200 uL — ABNORMAL LOW (ref 400–2700)

## 2011-01-04 ENCOUNTER — Encounter: Payer: Self-pay | Admitting: Adult Health

## 2011-01-04 ENCOUNTER — Other Ambulatory Visit: Payer: Self-pay | Admitting: *Deleted

## 2011-01-04 DIAGNOSIS — B20 Human immunodeficiency virus [HIV] disease: Secondary | ICD-10-CM

## 2011-01-04 MED ORDER — RITONAVIR 100 MG PO CAPS: 100.0000 mg | ORAL_CAPSULE | Freq: Every day | ORAL | Status: DC

## 2011-01-04 MED ORDER — EMTRICITABINE-TENOFOVIR DF 200-300 MG PO TABS: 1.0000 | ORAL_TABLET | Freq: Every day | ORAL | Status: DC

## 2011-01-04 MED ORDER — SULFAMETHOXAZOLE-TMP DS 800-160 MG PO TABS: 1.0000 | ORAL_TABLET | Freq: Every day | ORAL | Status: DC

## 2011-01-04 MED ORDER — DARUNAVIR ETHANOLATE 400 MG PO TABS: 400.0000 mg | ORAL_TABLET | Freq: Every day | ORAL | Status: DC

## 2011-02-11 ENCOUNTER — Telehealth: Payer: Self-pay | Admitting: *Deleted

## 2011-02-11 DIAGNOSIS — B20 Human immunodeficiency virus [HIV] disease: Secondary | ICD-10-CM

## 2011-02-11 MED ORDER — EMTRICITABINE-TENOFOVIR DF 200-300 MG PO TABS: 1.0000 | ORAL_TABLET | Freq: Every day | ORAL | Status: DC

## 2011-02-11 MED ORDER — RITONAVIR 100 MG PO CAPS: 100.0000 mg | ORAL_CAPSULE | Freq: Every day | ORAL | Status: DC

## 2011-02-11 MED ORDER — DARUNAVIR ETHANOLATE 400 MG PO TABS: 800.0000 mg | ORAL_TABLET | Freq: Every day | ORAL | Status: DC

## 2011-02-11 MED ORDER — SULFAMETHOXAZOLE-TMP DS 800-160 MG PO TABS: 1.0000 | ORAL_TABLET | Freq: Every day | ORAL | Status: DC

## 2011-03-08 ENCOUNTER — Other Ambulatory Visit: Payer: Self-pay | Admitting: *Deleted

## 2011-03-08 DIAGNOSIS — B20 Human immunodeficiency virus [HIV] disease: Secondary | ICD-10-CM

## 2011-03-08 MED ORDER — EMTRICITABINE-TENOFOVIR DF 200-300 MG PO TABS: 1.0000 | ORAL_TABLET | Freq: Every day | ORAL | Status: DC

## 2011-04-03 DIAGNOSIS — B20 Human immunodeficiency virus [HIV] disease: Secondary | ICD-10-CM

## 2011-04-03 DIAGNOSIS — Z113 Encounter for screening for infections with a predominantly sexual mode of transmission: Secondary | ICD-10-CM

## 2011-04-04 ENCOUNTER — Other Ambulatory Visit: Payer: Self-pay | Admitting: Infectious Diseases

## 2011-04-04 ENCOUNTER — Other Ambulatory Visit (INDEPENDENT_AMBULATORY_CARE_PROVIDER_SITE_OTHER): Payer: Self-pay

## 2011-04-04 ENCOUNTER — Other Ambulatory Visit: Payer: Self-pay | Admitting: Licensed Clinical Social Worker

## 2011-04-04 DIAGNOSIS — B20 Human immunodeficiency virus [HIV] disease: Secondary | ICD-10-CM

## 2011-04-04 LAB — CBC WITH DIFFERENTIAL/PLATELET
Basophils Relative: 0 % (ref 0–1)
Eosinophils Absolute: 0.2 10*3/uL (ref 0.0–0.7)
Eosinophils Relative: 3 % (ref 0–5)
MCH: 30 pg (ref 26.0–34.0)
Monocytes Relative: 4 % (ref 3–12)
Neutrophils Relative %: 73 % (ref 43–77)
RBC: 4.27 MIL/uL (ref 4.22–5.81)
RDW: 11.8 % (ref 11.5–15.5)

## 2011-04-05 LAB — GC/CHLAMYDIA PROBE AMP, URINE: Chlamydia, Swab/Urine, PCR: NEGATIVE

## 2011-04-05 LAB — COMPLETE METABOLIC PANEL WITH GFR
ALT: 13 U/L (ref 0–53)
AST: 22 U/L (ref 0–37)
Albumin: 4 g/dL (ref 3.5–5.2)
Alkaline Phosphatase: 59 U/L (ref 39–117)
BUN: 12 mg/dL (ref 6–23)
Chloride: 105 mEq/L (ref 96–112)
GFR, Est African American: >60 mL/min (ref >60)
GFR, Est Non African American: >60 mL/min (ref >60)
Glucose, Bld: 69 mg/dL — ABNORMAL LOW (ref 70–99)
Potassium: 3.8 mEq/L (ref 3.5–5.3)
Total Bilirubin: 0.5 mg/dL (ref 0.3–1.2)

## 2011-04-05 LAB — T-HELPER CELL (CD4) - (RCID CLINIC ONLY): CD4 % Helper T Cell: 19 % — ABNORMAL LOW (ref 33–55)

## 2011-04-08 LAB — HIV-1 RNA QUANT-NO REFLEX-BLD: HIV-1 RNA Quant, Log: <1.3 {Log} (ref <1.30)

## 2011-04-18 ENCOUNTER — Ambulatory Visit: Payer: Self-pay | Admitting: Adult Health

## 2011-04-18 ENCOUNTER — Telehealth: Payer: Self-pay | Admitting: *Deleted

## 2011-04-18 NOTE — Telephone Encounter (Signed)
LM asking him to call & reschedule the appt he missed this am

## 2011-04-25 ENCOUNTER — Other Ambulatory Visit: Payer: Self-pay | Admitting: *Deleted

## 2011-04-25 DIAGNOSIS — B20 Human immunodeficiency virus [HIV] disease: Secondary | ICD-10-CM

## 2011-04-25 MED ORDER — EMTRICITABINE-TENOFOVIR DF 200-300 MG PO TABS: 1.0000 | ORAL_TABLET | Freq: Every day | ORAL | Status: DC

## 2011-04-25 NOTE — Telephone Encounter (Signed)
Phone message left for pt to call ASAP to make and keep OV with provider.  Pt has not had an OV since 04/2010.  Jennet Maduro, RN

## 2011-06-26 ENCOUNTER — Ambulatory Visit (INDEPENDENT_AMBULATORY_CARE_PROVIDER_SITE_OTHER): Payer: Self-pay | Admitting: *Deleted

## 2011-06-26 DIAGNOSIS — Z23 Encounter for immunization: Secondary | ICD-10-CM

## 2011-06-27 ENCOUNTER — Ambulatory Visit: Payer: Self-pay | Admitting: Internal Medicine

## 2011-07-03 ENCOUNTER — Ambulatory Visit (INDEPENDENT_AMBULATORY_CARE_PROVIDER_SITE_OTHER): Payer: Self-pay | Admitting: Internal Medicine

## 2011-07-03 ENCOUNTER — Encounter: Payer: Self-pay | Admitting: Internal Medicine

## 2011-07-03 VITALS — BP 113/70 | HR 69 | Temp 98.0°F | Ht 67.0 in | Wt 147.0 lb

## 2011-07-03 DIAGNOSIS — B20 Human immunodeficiency virus [HIV] disease: Secondary | ICD-10-CM

## 2011-07-03 DIAGNOSIS — Z113 Encounter for screening for infections with a predominantly sexual mode of transmission: Secondary | ICD-10-CM

## 2011-07-03 DIAGNOSIS — Z79899 Other long term (current) drug therapy: Secondary | ICD-10-CM

## 2011-07-03 DIAGNOSIS — Z23 Encounter for immunization: Secondary | ICD-10-CM

## 2011-07-03 NOTE — Patient Instructions (Signed)
Return fasting next visit

## 2011-07-03 NOTE — Progress Notes (Signed)
  Subjective:    Patient ID: Juan Aguilar, male    DOB: 28-Aug-1975, 35 y.o.   MRN: [id number]  HPIthis patient comes in for routine followup. He has been on a regimen of boosted Prezista and Truvada. He continues to take it daily and reports 100% compliance. He also reports good tolerance to medications and today has no particular complaints. He also continues to take his Bactrim daily for prophylaxis. He's had no recent hospitalizations or sexual transmitted infections.    Review of Systems  Constitutional: Negative for fever, chills, appetite change, fatigue and unexpected weight change.  Respiratory: Negative for cough.   Gastrointestinal: Negative for abdominal pain, diarrhea and constipation.  Genitourinary: Negative for discharge.  Musculoskeletal: Negative for myalgias and arthralgias.  Skin: Negative for pallor and rash.  Neurological: Negative for headaches.  Hematological: Negative for adenopathy.       Objective:   Physical Exam  Constitutional: He is oriented to person, place, and time. He appears well-developed and well-nourished. No distress.  HENT:  Mouth/Throat: Oropharynx is clear and moist. No oropharyngeal exudate.  Eyes: No scleral icterus.  Cardiovascular: Normal rate, regular rhythm and normal heart sounds.   No murmur heard. Pulmonary/Chest: Effort normal and breath sounds normal. No respiratory distress. He has no wheezes.  Abdominal: Soft. Bowel sounds are normal. There is no tenderness. There is no rebound.  Genitourinary: Penis normal. No penile tenderness.  Lymphadenopathy:    He has no cervical adenopathy.  Neurological: He is alert and oriented to person, place, and time.  Skin: Skin is warm and dry. No rash noted. No erythema.  Psychiatric: He has a normal mood and affect. His behavior is normal.          Assessment & Plan:

## 2011-07-03 NOTE — Assessment & Plan Note (Signed)
The patient continues to do very well with his current regimen. He has good tolerance and no side effects. He is very pleased with this regimen therefore we'll continue with this. I did discuss condom use the patient he does state that he is at what uses that with all sexual activity. He is up-to-date on his vaccinations today and he will return in about 4 months. I did say that he can stop his Bactrim prophylaxis daily starting today since he has maintained a CD4 count over 200 for more than 6 months. He was told to call if he has any problems over the meantime but otherwise will follow up in 4 months.

## 2011-07-04 LAB — COMPLETE METABOLIC PANEL WITH GFR
Albumin: 4.5 g/dL (ref 3.5–5.2)
BUN: 8 mg/dL (ref 6–23)
CO2: 28 mEq/L (ref 19–32)
Calcium: 9.5 mg/dL (ref 8.4–10.5)
Chloride: 105 mEq/L (ref 96–112)
GFR, Est African American: >89 mL/min/{1.73_m2}
GFR, Est Non African American: >89 mL/min/{1.73_m2}
Glucose, Bld: 67 mg/dL — ABNORMAL LOW (ref 70–99)
Potassium: 3.8 mEq/L (ref 3.5–5.3)
Sodium: 137 mEq/L (ref 135–145)
Total Protein: 7.7 g/dL (ref 6.0–8.3)

## 2011-07-04 LAB — CBC WITH DIFFERENTIAL/PLATELET
Basophils Absolute: 0 10*3/uL (ref 0.0–0.1)
Basophils Relative: 0 % (ref 0–1)
HCT: 40.9 % (ref 39.0–52.0)
Lymphocytes Relative: 30 % (ref 12–46)
MCHC: 33.7 g/dL (ref 30.0–36.0)
Monocytes Absolute: 0.3 10*3/uL (ref 0.1–1.0)
Neutro Abs: 3.2 10*3/uL (ref 1.7–7.7)
Platelets: 194 10*3/uL (ref 150–400)
RDW: 12.2 % (ref 11.5–15.5)
WBC: 5.3 10*3/uL (ref 4.0–10.5)

## 2011-07-04 LAB — T-HELPER CELL (CD4) - (RCID CLINIC ONLY): CD4 T Cell Abs: 310 uL — ABNORMAL LOW (ref 400–2700)

## 2011-08-23 ENCOUNTER — Telehealth: Payer: Self-pay | Admitting: *Deleted

## 2011-08-23 NOTE — Telephone Encounter (Signed)
His wife called for him asking if we had form [phone] from [employer]. He needs a current form done for his immigration application. The last one we completed was an old form. I looked it up on line & could not find this form. I asked the lady to call [employer] back & askk where they can get a form. Then bring it here so md can complete

## 2011-08-30 ENCOUNTER — Other Ambulatory Visit: Payer: Self-pay | Admitting: Specialist

## 2011-08-30 ENCOUNTER — Telehealth: Payer: Self-pay | Admitting: *Deleted

## 2011-08-30 ENCOUNTER — Ambulatory Visit
Admission: RE | Admit: 2011-08-30 | Discharge: 2011-08-30 | Disposition: A | Discharge status: Home / Self Care | Payer: No Typology Code available for payment source | Source: Ambulatory Visit | Attending: Specialist | Admitting: Specialist

## 2011-08-30 DIAGNOSIS — B20 Human immunodeficiency virus [HIV] disease: Secondary | ICD-10-CM

## 2011-08-30 NOTE — Telephone Encounter (Signed)
I gave him a copy of his immunizations. I faxed it to the md # he asked me to

## 2011-09-06 ENCOUNTER — Other Ambulatory Visit: Payer: Self-pay | Admitting: *Deleted

## 2011-09-06 DIAGNOSIS — B20 Human immunodeficiency virus [HIV] disease: Secondary | ICD-10-CM

## 2011-09-06 MED ORDER — DARUNAVIR ETHANOLATE 800 MG PO TABS: 800.0000 mg | ORAL_TABLET | Freq: Every day | ORAL | Status: DC

## 2011-09-06 MED ORDER — RITONAVIR 100 MG PO CAPS: 100.0000 mg | ORAL_CAPSULE | Freq: Every day | ORAL | Status: DC

## 2011-09-06 MED ORDER — EMTRICITABINE-TENOFOVIR DF 200-300 MG PO TABS: 1.0000 | ORAL_TABLET | Freq: Every day | ORAL | Status: DC

## 2011-11-01 ENCOUNTER — Ambulatory Visit (INDEPENDENT_AMBULATORY_CARE_PROVIDER_SITE_OTHER): Payer: No Typology Code available for payment source | Admitting: Infectious Disease

## 2011-11-01 ENCOUNTER — Encounter: Payer: Self-pay | Admitting: Infectious Disease

## 2011-11-01 VITALS — BP 104/66 | HR 64 | Temp 98.2°F | Wt 140.0 lb

## 2011-11-01 DIAGNOSIS — S62639A Displaced fracture of distal phalanx of unspecified finger, initial encounter for closed fracture: Secondary | ICD-10-CM

## 2011-11-01 DIAGNOSIS — Z23 Encounter for immunization: Secondary | ICD-10-CM

## 2011-11-01 DIAGNOSIS — Z113 Encounter for screening for infections with a predominantly sexual mode of transmission: Secondary | ICD-10-CM

## 2011-11-01 DIAGNOSIS — S61219A Laceration without foreign body of unspecified finger without damage to nail, initial encounter: Secondary | ICD-10-CM

## 2011-11-01 DIAGNOSIS — B20 Human immunodeficiency virus [HIV] disease: Secondary | ICD-10-CM

## 2011-11-01 DIAGNOSIS — S61209A Unspecified open wound of unspecified finger without damage to nail, initial encounter: Secondary | ICD-10-CM

## 2011-11-01 DIAGNOSIS — Z79899 Other long term (current) drug therapy: Secondary | ICD-10-CM

## 2011-11-01 DIAGNOSIS — S62631A Displaced fracture of distal phalanx of left index finger, initial encounter for closed fracture: Secondary | ICD-10-CM | POA: Insufficient documentation

## 2011-11-01 LAB — CBC WITH DIFFERENTIAL/PLATELET
Basophils Relative: 0 % (ref 0–1)
HCT: 41.1 % (ref 39.0–52.0)
Hemoglobin: 14.2 g/dL (ref 13.0–17.0)
Lymphocytes Relative: 25 % (ref 12–46)
Lymphs Abs: 1.8 10*3/uL (ref 0.7–4.0)
MCHC: 34.5 g/dL (ref 30.0–36.0)
Monocytes Absolute: 0.6 10*3/uL (ref 0.1–1.0)
Monocytes Relative: 9 % (ref 3–12)
Neutro Abs: 4.3 10*3/uL (ref 1.7–7.7)
Neutrophils Relative %: 61 % (ref 43–77)
RBC: 4.73 MIL/uL (ref 4.22–5.81)
WBC: 7 10*3/uL (ref 4.0–10.5)

## 2011-11-01 LAB — COMPLETE METABOLIC PANEL WITH GFR
ALT: 13 U/L (ref 0–53)
AST: 22 U/L (ref 0–37)
Alkaline Phosphatase: 69 U/L (ref 39–117)
BUN: 13 mg/dL (ref 6–23)
Calcium: 9.8 mg/dL (ref 8.4–10.5)
Chloride: 99 mEq/L (ref 96–112)
Creat: 0.98 mg/dL (ref 0.50–1.35)

## 2011-11-01 LAB — LIPID PANEL
Cholesterol: 217 mg/dL — ABNORMAL HIGH (ref 0–200)
LDL Cholesterol: 151 mg/dL — ABNORMAL HIGH (ref 0–99)
Total CHOL/HDL Ratio: 4 Ratio
Triglycerides: 61 mg/dL (ref <150)
VLDL: 12 mg/dL (ref 0–40)

## 2011-11-01 LAB — T-HELPER CELL (CD4) - (RCID CLINIC ONLY)
CD4 % Helper T Cell: 20 % — ABNORMAL LOW (ref 33–55)
CD4 T Cell Abs: 370 uL — ABNORMAL LOW (ref 400–2700)

## 2011-11-01 MED ORDER — HYDROCODONE-ACETAMINOPHEN 5-500 MG PO TABS: 2.0000 | ORAL_TABLET | Freq: Three times a day (TID) | ORAL | Status: AC | PRN

## 2011-11-01 MED ORDER — SULFAMETHOXAZOLE-TRIMETHOPRIM 800-160 MG PO TABS: 2.0000 | ORAL_TABLET | Freq: Two times a day (BID) | ORAL | Status: AC

## 2011-11-01 NOTE — Assessment & Plan Note (Signed)
--  area prepared and cleaned in usual sterile manner with betadine --1% lidocaine infiltrated skin to achieve anethesia --wound examined after anesthesia achieved with forceps  --there is NO role for sutures at this point, I do not suspect deeper injury but will get xray keepig in mind he has old fracture at this site --10 days of high dose bactrim --DTP -wound care --rtc next Friday

## 2011-11-01 NOTE — Patient Instructions (Signed)
Please take the antibiotics as prescribed  Keep the wound clean and change dressing daily  Apply topical antibiotic (bacitracin to the wound bed)  RTC next Friday to see Dr Daiva Eves

## 2011-11-01 NOTE — Progress Notes (Signed)
Subjective:    Patient ID: Juan Aguilar, male    DOB: 03-24-1976, 36 y.o.   MRN: [id number]  HPI  36 year old [name] with HIV perfectly  Controlled on prezista norvir and truvada presents acutely to [facility]. 3 days ago while trying to change a tire he lacerated his index finger left hand. His wife helped him clean out the wound and close it. He presents to clinic today because it is still very painful. He has not yet been seen by any MD for this. He has not had a tetanus shot in past 5 years. I examined the wound after cleaning it with betadine and injecting skin with 1% lidocaine. There was NO purulence. The laceration was complicated and V shaped and given age and location not appropriate for suturing. I am giving him bactrimDS two bid x10 days, wound care with bacitracin and gauze, tetanus shot and vicodin rx. I will also check xray of the finger.  I spent greater than 45 minutes with the patient including greater than 50% of time in face to face counsel of the patient and in coordination of their care. He claims to NOT have missed any ARV since last appt.  Review of Systems  Constitutional: Negative for fever, chills, diaphoresis, activity change, appetite change, fatigue and unexpected weight change.  HENT: Negative for congestion, sore throat, rhinorrhea, sneezing, trouble swallowing and sinus pressure.   Eyes: Negative for photophobia and visual disturbance.  Respiratory: Negative for cough, chest tightness, shortness of breath, wheezing and stridor.   Cardiovascular: Negative for chest pain, palpitations and leg swelling.  Gastrointestinal: Negative for nausea, vomiting, abdominal pain, diarrhea, constipation, blood in stool, abdominal distention and anal bleeding.  Genitourinary: Negative for dysuria, hematuria, flank pain and difficulty urinating.  Musculoskeletal: Positive for arthralgias. Negative for myalgias, back pain, joint swelling and gait problem.  Skin: Negative for color  change, pallor, rash and wound.  Neurological: Negative for dizziness, tremors, weakness and light-headedness.  Hematological: Negative for adenopathy. Does not bruise/bleed easily.  Psychiatric/Behavioral: Negative for behavioral problems, confusion, sleep disturbance, dysphoric mood, decreased concentration and agitation.       Objective:   Physical Exam  Constitutional: He is oriented to person, place, and time. He appears well-developed and well-nourished. No distress.  HENT:  Head: Normocephalic and atraumatic.  Mouth/Throat: Oropharynx is clear and moist. No oropharyngeal exudate.  Eyes: Conjunctivae and EOM are normal. Pupils are equal, round, and reactive to light. No scleral icterus.  Neck: Normal range of motion. Neck supple. No JVD present.  Cardiovascular: Normal rate, regular rhythm and normal heart sounds.  Exam reveals no gallop and no friction rub.   No murmur heard. Pulmonary/Chest: Effort normal and breath sounds normal. No respiratory distress. He has no wheezes. He has no rales. He exhibits no tenderness.  Abdominal: He exhibits no distension and no mass. There is no tenderness. There is no rebound and no guarding.  Musculoskeletal: He exhibits no edema and no tenderness.       Arms: Lymphadenopathy:    He has no cervical adenopathy.  Neurological: He is alert and oriented to person, place, and time. He has normal reflexes. He exhibits normal muscle tone. Coordination normal.  Skin: Skin is warm and dry. He is not diaphoretic. No erythema. No pallor.  Psychiatric: He has a normal mood and affect. His behavior is normal. Judgment and thought content normal.          Assessment & Plan:  Complicated laceration of finger  --  area prepared and cleaned in usual sterile manner with betadine --1% lidocaine infiltrated skin to achieve anethesia --wound examined after anesthesia achieved with forceps  --there is NO role for sutures at this point, I do not suspect  deeper injury but will get xray keepig in mind he has old fracture at this site --10 days of high dose bactrim --DTP -wound care --rtc next Friday  HIV DISEASE Check labs today  Closed displaced fracture of distal phalanx of left index finger remote

## 2011-11-01 NOTE — Assessment & Plan Note (Signed)
Check labs today.

## 2011-11-01 NOTE — Assessment & Plan Note (Signed)
remote 

## 2011-11-05 LAB — HIV-1 RNA QUANT-NO REFLEX-BLD
HIV 1 RNA Quant: <20 copies/mL (ref <20)
HIV-1 RNA Quant, Log: <1.3 {Log} (ref <1.30)

## 2011-11-08 ENCOUNTER — Encounter: Payer: Self-pay | Admitting: Infectious Diseases

## 2011-11-08 ENCOUNTER — Ambulatory Visit (INDEPENDENT_AMBULATORY_CARE_PROVIDER_SITE_OTHER): Payer: Self-pay | Admitting: Infectious Diseases

## 2011-11-08 VITALS — BP 119/73 | HR 63 | Temp 98.0°F | Resp 16 | Ht 68.0 in | Wt 146.2 lb

## 2011-11-08 DIAGNOSIS — S61209A Unspecified open wound of unspecified finger without damage to nail, initial encounter: Secondary | ICD-10-CM

## 2011-11-08 DIAGNOSIS — S61219A Laceration without foreign body of unspecified finger without damage to nail, initial encounter: Secondary | ICD-10-CM

## 2011-11-08 DIAGNOSIS — B20 Human immunodeficiency virus [HIV] disease: Secondary | ICD-10-CM

## 2011-11-08 NOTE — Assessment & Plan Note (Signed)
Wound is healing well, he has gotten TET vaccine. Is resolving.

## 2011-11-08 NOTE — Assessment & Plan Note (Signed)
He is doing very well. will continue his current ART. He is given condoms. Vaccines are up to date. He is given a copy of his labs. See him back in 6 months with labs prior.

## 2011-11-08 NOTE — Progress Notes (Signed)
  Subjective:    Patient ID: Juan Aguilar, male    DOB: 1976-06-13, 36 y.o.   MRN: [id number]  HPI 36 yo M with hx of HIV+, from [place] in [place] since 2001. Was dx prior to arriving in [place], DRVr/TRV, states he has been on ART since 2007 , denies missed doses. Seen 1 week ago for laceration to his L hand. He was seen in ID clinic and underwent cleaning of wound, TET vaccine. Wound has been healing well. No f/c. No wound drainage.   HIV 1 RNA Quant (copies/mL)  Date Value  11/01/2011 <20   07/03/2011 NOT DETECTED   04/04/2011 <20      CD4 T Cell Abs (cmm)  Date Value  11/01/2011 370*  07/03/2011 310*  04/04/2011 290*      Review of Systems  Constitutional: Negative for fever, chills, appetite change and unexpected weight change.  Gastrointestinal: Negative for diarrhea and constipation.  Genitourinary: Negative for dysuria.       Objective:   Physical Exam  Constitutional: He appears well-developed and well-nourished.  HENT:  Head: Normocephalic and atraumatic.  Eyes: EOM are normal. Pupils are equal, round, and reactive to light.  Neck: Neck supple.  Cardiovascular: Normal rate, regular rhythm and normal heart sounds.   Pulmonary/Chest: Effort normal and breath sounds normal.  Abdominal: Soft. Bowel sounds are normal. There is no tenderness.  Musculoskeletal:       Arms: Lymphadenopathy:    He has no cervical adenopathy.          Assessment & Plan:

## 2011-11-15 ENCOUNTER — Other Ambulatory Visit: Payer: Self-pay | Admitting: Infectious Diseases

## 2011-11-18 ENCOUNTER — Other Ambulatory Visit: Payer: Self-pay | Admitting: *Deleted

## 2011-11-18 DIAGNOSIS — B20 Human immunodeficiency virus [HIV] disease: Secondary | ICD-10-CM

## 2011-11-18 MED ORDER — DARUNAVIR ETHANOLATE 800 MG PO TABS: 800.0000 mg | ORAL_TABLET | Freq: Every day | ORAL | Status: DC

## 2011-12-19 ENCOUNTER — Telehealth: Payer: Self-pay | Admitting: *Deleted

## 2011-12-19 NOTE — Telephone Encounter (Signed)
[facility] called they are having trouble getting in touch with the patient to arrange his medication delivery. She wanted to check contact information with [place]. We have the same info on file.

## 2012-03-05 ENCOUNTER — Other Ambulatory Visit: Payer: Self-pay | Admitting: *Deleted

## 2012-05-12 ENCOUNTER — Other Ambulatory Visit: Payer: Self-pay

## 2012-06-03 ENCOUNTER — Ambulatory Visit: Payer: Self-pay

## 2012-06-03 ENCOUNTER — Ambulatory Visit (INDEPENDENT_AMBULATORY_CARE_PROVIDER_SITE_OTHER): Payer: Medicaid Other

## 2012-06-03 ENCOUNTER — Other Ambulatory Visit: Payer: Self-pay | Admitting: Infectious Disease

## 2012-06-03 ENCOUNTER — Other Ambulatory Visit (INDEPENDENT_AMBULATORY_CARE_PROVIDER_SITE_OTHER): Payer: Medicaid Other

## 2012-06-03 ENCOUNTER — Other Ambulatory Visit (HOSPITAL_COMMUNITY)
Admission: RE | Admit: 2012-06-03 | Discharge: 2012-06-03 | Disposition: A | Discharge status: Home / Self Care | Payer: Medicaid Other | Source: Ambulatory Visit | Attending: Infectious Disease | Admitting: Infectious Disease

## 2012-06-03 DIAGNOSIS — Z23 Encounter for immunization: Secondary | ICD-10-CM

## 2012-06-03 DIAGNOSIS — Z113 Encounter for screening for infections with a predominantly sexual mode of transmission: Secondary | ICD-10-CM

## 2012-06-03 DIAGNOSIS — B20 Human immunodeficiency virus [HIV] disease: Secondary | ICD-10-CM

## 2012-06-03 LAB — COMPLETE METABOLIC PANEL WITH GFR
ALT: 10 U/L (ref 0–53)
AST: 16 U/L (ref 0–37)
Albumin: 3.8 g/dL (ref 3.5–5.2)
Alkaline Phosphatase: 60 U/L (ref 39–117)
BUN: 13 mg/dL (ref 6–23)
CO2: 25 mEq/L (ref 19–32)
Calcium: 9.1 mg/dL (ref 8.4–10.5)
Chloride: 104 mEq/L (ref 96–112)
Creat: 0.9 mg/dL (ref 0.50–1.35)
GFR, Est African American: >89 mL/min
GFR, Est Non African American: >89 mL/min
Glucose, Bld: 93 mg/dL (ref 70–99)
Potassium: 4.1 mEq/L (ref 3.5–5.3)
Sodium: 140 mEq/L (ref 135–145)
Total Bilirubin: 0.4 mg/dL (ref 0.3–1.2)
Total Protein: 7.2 g/dL (ref 6.0–8.3)

## 2012-06-03 LAB — RPR

## 2012-06-03 NOTE — Addendum Note (Signed)
Addended by: Mariea Clonts D on: 06/03/2012 09:27 AM   Modules accepted: Orders

## 2012-06-04 LAB — CBC WITH DIFFERENTIAL/PLATELET
Basophils Absolute: 0 10*3/uL (ref 0.0–0.1)
Basophils Relative: 1 % (ref 0–1)
HCT: 39.7 % (ref 39.0–52.0)
Hemoglobin: 12.9 g/dL — ABNORMAL LOW (ref 13.0–17.0)
Lymphocytes Relative: 31 % (ref 12–46)
MCHC: 32.5 g/dL (ref 30.0–36.0)
Monocytes Absolute: 0.4 10*3/uL (ref 0.1–1.0)
Monocytes Relative: 7 % (ref 3–12)
Neutro Abs: 3.9 10*3/uL (ref 1.7–7.7)
Neutrophils Relative %: 59 % (ref 43–77)
RDW: 12.2 % (ref 11.5–15.5)
WBC: 6.5 10*3/uL (ref 4.0–10.5)

## 2012-06-04 LAB — HIV-1 RNA QUANT-NO REFLEX-BLD: HIV-1 RNA Quant, Log: 1.73 {Log} — ABNORMAL HIGH (ref <1.30)

## 2012-06-04 LAB — T-HELPER CELL (CD4) - (RCID CLINIC ONLY)
CD4 % Helper T Cell: 17 % — ABNORMAL LOW (ref 33–55)
CD4 T Cell Abs: 310 uL — ABNORMAL LOW (ref 400–2700)

## 2012-06-17 ENCOUNTER — Ambulatory Visit: Payer: Self-pay | Admitting: Infectious Disease

## 2012-06-17 ENCOUNTER — Telehealth: Payer: Self-pay | Admitting: Licensed Clinical Social Worker

## 2012-06-17 NOTE — Telephone Encounter (Signed)
I called the number listed on his chart and someone answered that they did not know this patient, and asked me to remove the number.

## 2012-09-09 ENCOUNTER — Other Ambulatory Visit: Payer: Self-pay | Admitting: *Deleted

## 2012-09-09 DIAGNOSIS — B20 Human immunodeficiency virus [HIV] disease: Secondary | ICD-10-CM

## 2012-09-09 MED ORDER — EMTRICITABINE-TENOFOVIR DF 200-300 MG PO TABS: 1.0000 | ORAL_TABLET | Freq: Every day | ORAL | Status: DC

## 2012-09-09 MED ORDER — RITONAVIR 100 MG PO CAPS: 100.0000 mg | ORAL_CAPSULE | Freq: Every day | ORAL | Status: DC

## 2012-10-20 ENCOUNTER — Other Ambulatory Visit (INDEPENDENT_AMBULATORY_CARE_PROVIDER_SITE_OTHER): Payer: Self-pay

## 2012-10-20 DIAGNOSIS — B20 Human immunodeficiency virus [HIV] disease: Secondary | ICD-10-CM

## 2012-10-20 LAB — CBC
HCT: 40.1 % (ref 39.0–52.0)
Hemoglobin: 13.6 g/dL (ref 13.0–17.0)
MCH: 30 pg (ref 26.0–34.0)
MCHC: 33.9 g/dL (ref 30.0–36.0)
MCV: 88.3 fL (ref 78.0–100.0)
RDW: 12.4 % (ref 11.5–15.5)

## 2012-10-20 LAB — COMPREHENSIVE METABOLIC PANEL
ALT: 12 U/L (ref 0–53)
AST: 18 U/L (ref 0–37)
Alkaline Phosphatase: 64 U/L (ref 39–117)
BUN: 11 mg/dL (ref 6–23)
Creat: 1.03 mg/dL (ref 0.50–1.35)

## 2012-10-21 LAB — T-HELPER CELL (CD4) - (RCID CLINIC ONLY): CD4 % Helper T Cell: 17 % — ABNORMAL LOW (ref 33–55)

## 2012-11-02 ENCOUNTER — Ambulatory Visit: Payer: Self-pay | Admitting: Infectious Disease

## 2012-11-03 ENCOUNTER — Ambulatory Visit (INDEPENDENT_AMBULATORY_CARE_PROVIDER_SITE_OTHER): Payer: Self-pay | Admitting: Infectious Disease

## 2012-11-03 ENCOUNTER — Encounter: Payer: Self-pay | Admitting: Infectious Disease

## 2012-11-03 VITALS — BP 115/74 | HR 88 | Temp 98.6°F | Ht 68.0 in | Wt 139.0 lb

## 2012-11-03 DIAGNOSIS — B20 Human immunodeficiency virus [HIV] disease: Secondary | ICD-10-CM

## 2012-11-03 DIAGNOSIS — Z5189 Encounter for other specified aftercare: Secondary | ICD-10-CM

## 2012-11-03 DIAGNOSIS — M545 Low back pain: Secondary | ICD-10-CM

## 2012-11-03 DIAGNOSIS — S61412D Laceration without foreign body of left hand, subsequent encounter: Secondary | ICD-10-CM

## 2012-11-03 MED ORDER — NAPROXEN 500 MG PO TABS: 500.0000 mg | ORAL_TABLET | Freq: Two times a day (BID) | ORAL | Status: DC

## 2012-11-03 MED ORDER — CYCLOBENZAPRINE HCL 10 MG PO TABS: ORAL_TABLET | ORAL | Status: DC

## 2012-11-03 NOTE — Progress Notes (Signed)
  Subjective:    Patient ID: Juan Aguilar, male    DOB: [birth year], 37 y.o.   MRN: [id number]  HPI   37 year old [name] with HIV perfectly  Controlled on prezista norvir and truvada presents to [facility] for followup.  He recently pulled his back muscle while rowing at the gym and would like something to help with this. The vicodin we gave him for his finger laceration worked well but I told him I would prefer to give him flexeril and NSAID for now.    Review of Systems  Constitutional: Negative for fever, chills, diaphoresis, activity change, appetite change, fatigue and unexpected weight change.  HENT: Negative for congestion, sore throat, rhinorrhea, sneezing, trouble swallowing and sinus pressure.   Eyes: Negative for photophobia and visual disturbance.  Respiratory: Negative for cough, chest tightness, shortness of breath, wheezing and stridor.   Cardiovascular: Negative for chest pain, palpitations and leg swelling.  Gastrointestinal: Negative for nausea, vomiting, abdominal pain, diarrhea, constipation, blood in stool, abdominal distention and anal bleeding.  Genitourinary: Negative for dysuria, hematuria, flank pain and difficulty urinating.  Musculoskeletal: Positive for back pain. Negative for myalgias, joint swelling, arthralgias and gait problem.  Skin: Negative for color change, pallor, rash and wound.  Neurological: Negative for dizziness, tremors, weakness and light-headedness.  Hematological: Negative for adenopathy. Does not bruise/bleed easily.  Psychiatric/Behavioral: Negative for behavioral problems, confusion, sleep disturbance, dysphoric mood, decreased concentration and agitation.       Objective:   Physical Exam  Constitutional: He is oriented to person, place, and time. He appears well-developed and well-nourished. No distress.  HENT:  Head: Normocephalic and atraumatic.  Mouth/Throat: Oropharynx is clear and moist. No oropharyngeal exudate.  Eyes:  Conjunctivae and EOM are normal. Pupils are equal, round, and reactive to light. No scleral icterus.  Neck: Normal range of motion. Neck supple. No JVD present.  Cardiovascular: Normal rate, regular rhythm and normal heart sounds.  Exam reveals no gallop and no friction rub.   No murmur heard. Pulmonary/Chest: Effort normal and breath sounds normal. No respiratory distress. He has no wheezes. He has no rales. He exhibits no tenderness.  Abdominal: He exhibits no distension and no mass. There is no tenderness. There is no rebound and no guarding.  Musculoskeletal: He exhibits no edema and no tenderness.  Lymphadenopathy:    He has no cervical adenopathy.  Neurological: He is alert and oriented to person, place, and time. He has normal reflexes. He exhibits normal muscle tone. Coordination normal.  Skin: Skin is warm and dry. He is not diaphoretic. No erythema. No pallor.  Psychiatric: He has a normal mood and affect. His behavior is normal. Judgment and thought content normal.          Assessment & Plan:   HIV: perfect control continue PRZ/Norvir/Truvada Recheck labs in 6 months  Lower back pain: try flexeril, and naproxen with fluids  Laceration: healed

## 2012-12-14 ENCOUNTER — Other Ambulatory Visit: Payer: Self-pay | Admitting: *Deleted

## 2012-12-14 DIAGNOSIS — B20 Human immunodeficiency virus [HIV] disease: Secondary | ICD-10-CM

## 2012-12-14 MED ORDER — DARUNAVIR ETHANOLATE 800 MG PO TABS: 800.0000 mg | ORAL_TABLET | Freq: Every day | ORAL | Status: DC

## 2013-04-22 ENCOUNTER — Other Ambulatory Visit (INDEPENDENT_AMBULATORY_CARE_PROVIDER_SITE_OTHER): Payer: Self-pay

## 2013-04-22 DIAGNOSIS — Z79899 Other long term (current) drug therapy: Secondary | ICD-10-CM

## 2013-04-22 DIAGNOSIS — Z113 Encounter for screening for infections with a predominantly sexual mode of transmission: Secondary | ICD-10-CM

## 2013-04-22 DIAGNOSIS — B20 Human immunodeficiency virus [HIV] disease: Secondary | ICD-10-CM

## 2013-04-22 LAB — CBC WITH DIFFERENTIAL/PLATELET
Basophils Absolute: 0 10*3/uL (ref 0.0–0.1)
Eosinophils Relative: 4 % (ref 0–5)
HCT: 40.8 % (ref 39.0–52.0)
Lymphocytes Relative: 32 % (ref 12–46)
Lymphs Abs: 1.9 10*3/uL (ref 0.7–4.0)
MCV: 89.3 fL (ref 78.0–100.0)
Neutro Abs: 3.4 10*3/uL (ref 1.7–7.7)
Platelets: 196 10*3/uL (ref 150–400)
RBC: 4.57 MIL/uL (ref 4.22–5.81)
RDW: 13 % (ref 11.5–15.5)
WBC: 6 10*3/uL (ref 4.0–10.5)

## 2013-04-22 LAB — COMPLETE METABOLIC PANEL WITH GFR
ALT: 11 U/L (ref 0–53)
Albumin: 4.1 g/dL (ref 3.5–5.2)
CO2: 24 mEq/L (ref 19–32)
GFR, Est African American: >89 mL/min
GFR, Est Non African American: >89 mL/min
Glucose, Bld: 104 mg/dL — ABNORMAL HIGH (ref 70–99)
Potassium: 4 mEq/L (ref 3.5–5.3)
Sodium: 137 mEq/L (ref 135–145)
Total Protein: 6.9 g/dL (ref 6.0–8.3)

## 2013-04-22 LAB — LIPID PANEL
LDL Cholesterol: 110 mg/dL — ABNORMAL HIGH (ref 0–99)
Total CHOL/HDL Ratio: 4.1 Ratio

## 2013-04-23 LAB — T-HELPER CELL (CD4) - (RCID CLINIC ONLY)
CD4 % Helper T Cell: 19 % — ABNORMAL LOW (ref 33–55)
CD4 T Cell Abs: 370 /uL — ABNORMAL LOW (ref 400–2700)

## 2013-04-26 LAB — HIV-1 RNA QUANT-NO REFLEX-BLD: HIV-1 RNA Quant, Log: <1.3 {Log} (ref <1.30)

## 2013-05-06 ENCOUNTER — Ambulatory Visit (INDEPENDENT_AMBULATORY_CARE_PROVIDER_SITE_OTHER): Payer: Self-pay | Admitting: Infectious Disease

## 2013-05-06 ENCOUNTER — Encounter: Payer: Self-pay | Admitting: Infectious Disease

## 2013-05-06 VITALS — BP 109/70 | HR 57 | Temp 97.6°F | Wt 138.5 lb

## 2013-05-06 DIAGNOSIS — M545 Low back pain: Secondary | ICD-10-CM

## 2013-05-06 DIAGNOSIS — B20 Human immunodeficiency virus [HIV] disease: Secondary | ICD-10-CM

## 2013-05-06 DIAGNOSIS — Z23 Encounter for immunization: Secondary | ICD-10-CM

## 2013-05-06 NOTE — Progress Notes (Signed)
  Subjective:    Patient ID: Juan Aguilar, male    DOB: [birth year], 37 y.o.   MRN: [id number]  HPI   37 year old [name] with HIV perfectly  Controlled on prezista norvir and truvada presents to [facility] for followup.  His muscle spasms are better now that he had course of flexeril and NSAID.    Review of Systems  Constitutional: Negative for fever, chills, diaphoresis, activity change, appetite change, fatigue and unexpected weight change.  HENT: Negative for congestion, sore throat, rhinorrhea, sneezing, trouble swallowing and sinus pressure.   Eyes: Negative for photophobia and visual disturbance.  Respiratory: Negative for cough, chest tightness, shortness of breath, wheezing and stridor.   Cardiovascular: Negative for chest pain, palpitations and leg swelling.  Gastrointestinal: Negative for nausea, vomiting, abdominal pain, diarrhea, constipation, blood in stool, abdominal distention and anal bleeding.  Genitourinary: Negative for dysuria, hematuria, flank pain and difficulty urinating.  Musculoskeletal: Positive for back pain. Negative for myalgias, joint swelling, arthralgias and gait problem.  Skin: Negative for color change, pallor, rash and wound.  Neurological: Negative for dizziness, tremors, weakness and light-headedness.  Hematological: Negative for adenopathy. Does not bruise/bleed easily.  Psychiatric/Behavioral: Negative for behavioral problems, confusion, sleep disturbance, dysphoric mood, decreased concentration and agitation.       Objective:   Physical Exam  Constitutional: He is oriented to person, place, and time. He appears well-developed and well-nourished. No distress.  HENT:  Head: Normocephalic and atraumatic.  Mouth/Throat: Oropharynx is clear and moist. No oropharyngeal exudate.  Eyes: Conjunctivae and EOM are normal. Pupils are equal, round, and reactive to light. No scleral icterus.  Neck: Normal range of motion. Neck supple. No JVD present.   Cardiovascular: Normal rate, regular rhythm and normal heart sounds.  Exam reveals no gallop and no friction rub.   No murmur heard. Pulmonary/Chest: Effort normal and breath sounds normal. No respiratory distress. He has no wheezes. He has no rales. He exhibits no tenderness.  Abdominal: He exhibits no distension and no mass. There is no tenderness. There is no rebound and no guarding.  Musculoskeletal: He exhibits no edema and no tenderness.  Lymphadenopathy:    He has no cervical adenopathy.  Neurological: He is alert and oriented to person, place, and time. He has normal reflexes. He exhibits normal muscle tone. Coordination normal.  Skin: Skin is warm and dry. He is not diaphoretic. No erythema. No pallor.  Psychiatric: He has a normal mood and affect. His behavior is normal. Judgment and thought content normal.          Assessment & Plan:   HIV: perfect control continue PRZ/Norvir/Truvada Recheck labs in 4 months  Lower back pain:resolved  HCM: flu shot

## 2013-08-30 ENCOUNTER — Other Ambulatory Visit: Payer: Self-pay

## 2013-09-01 ENCOUNTER — Other Ambulatory Visit (INDEPENDENT_AMBULATORY_CARE_PROVIDER_SITE_OTHER): Payer: Self-pay

## 2013-09-01 DIAGNOSIS — Z113 Encounter for screening for infections with a predominantly sexual mode of transmission: Secondary | ICD-10-CM

## 2013-09-01 DIAGNOSIS — Z79899 Other long term (current) drug therapy: Secondary | ICD-10-CM

## 2013-09-01 DIAGNOSIS — B20 Human immunodeficiency virus [HIV] disease: Secondary | ICD-10-CM

## 2013-09-01 LAB — LIPID PANEL
CHOL/HDL RATIO: 3.3 ratio
Cholesterol: 187 mg/dL (ref 0–200)
HDL: 57 mg/dL (ref >39)
LDL CALC: 113 mg/dL — AB (ref 0–99)
TRIGLYCERIDES: 84 mg/dL (ref <150)
VLDL: 17 mg/dL (ref 0–40)

## 2013-09-01 LAB — CBC WITH DIFFERENTIAL/PLATELET
BASOS ABS: 0 10*3/uL (ref 0.0–0.1)
BASOS PCT: 1 % (ref 0–1)
EOS PCT: 3 % (ref 0–5)
Eosinophils Absolute: 0.2 10*3/uL (ref 0.0–0.7)
HCT: 40.1 % (ref 39.0–52.0)
Hemoglobin: 13.5 g/dL (ref 13.0–17.0)
LYMPHS PCT: 32 % (ref 12–46)
Lymphs Abs: 1.9 10*3/uL (ref 0.7–4.0)
MCH: 29.7 pg (ref 26.0–34.0)
MCHC: 33.7 g/dL (ref 30.0–36.0)
MCV: 88.3 fL (ref 78.0–100.0)
Monocytes Absolute: 0.4 10*3/uL (ref 0.1–1.0)
Monocytes Relative: 8 % (ref 3–12)
NEUTROS ABS: 3.2 10*3/uL (ref 1.7–7.7)
Neutrophils Relative %: 56 % (ref 43–77)
PLATELETS: 176 10*3/uL (ref 150–400)
RBC: 4.54 MIL/uL (ref 4.22–5.81)
RDW: 12.5 % (ref 11.5–15.5)
WBC: 5.7 10*3/uL (ref 4.0–10.5)

## 2013-09-01 LAB — COMPLETE METABOLIC PANEL WITH GFR
ALK PHOS: 59 U/L (ref 39–117)
ALT: 16 U/L (ref 0–53)
AST: 16 U/L (ref 0–37)
Albumin: 4 g/dL (ref 3.5–5.2)
BUN: 11 mg/dL (ref 6–23)
CALCIUM: 9 mg/dL (ref 8.4–10.5)
CHLORIDE: 103 meq/L (ref 96–112)
CO2: 27 mEq/L (ref 19–32)
CREATININE: 0.82 mg/dL (ref 0.50–1.35)
GFR, Est African American: >89 mL/min
GFR, Est Non African American: >89 mL/min
Glucose, Bld: 89 mg/dL (ref 70–99)
Potassium: 4 mEq/L (ref 3.5–5.3)
Sodium: 137 mEq/L (ref 135–145)
Total Bilirubin: 0.4 mg/dL (ref 0.3–1.2)
Total Protein: 7.2 g/dL (ref 6.0–8.3)

## 2013-09-01 LAB — RPR

## 2013-09-03 LAB — HIV-1 RNA QUANT-NO REFLEX-BLD
HIV 1 RNA Quant: 69 {copies}/mL — ABNORMAL HIGH
HIV-1 RNA Quant, Log: 1.84 {Log} — ABNORMAL HIGH

## 2013-09-03 LAB — T-HELPER CELL (CD4) - (RCID CLINIC ONLY)
CD4 % Helper T Cell: 18 % — ABNORMAL LOW (ref 33–55)
CD4 T Cell Abs: 360 /uL — ABNORMAL LOW (ref 400–2700)

## 2013-09-13 ENCOUNTER — Ambulatory Visit: Payer: Self-pay | Admitting: Infectious Disease

## 2013-09-14 ENCOUNTER — Telehealth: Payer: Self-pay | Admitting: Licensed Clinical Social Worker

## 2013-09-14 NOTE — Telephone Encounter (Signed)
Left a message for patient to call our office regarding appointment

## 2013-10-02 ENCOUNTER — Other Ambulatory Visit: Payer: Self-pay | Admitting: Internal Medicine

## 2013-10-06 ENCOUNTER — Ambulatory Visit (INDEPENDENT_AMBULATORY_CARE_PROVIDER_SITE_OTHER): Payer: Self-pay | Admitting: Infectious Disease

## 2013-10-06 ENCOUNTER — Encounter: Payer: Self-pay | Admitting: Infectious Disease

## 2013-10-06 VITALS — BP 110/68 | HR 66 | Temp 97.6°F | Wt 143.5 lb

## 2013-10-06 DIAGNOSIS — B20 Human immunodeficiency virus [HIV] disease: Secondary | ICD-10-CM

## 2013-10-06 MED ORDER — DARUNAVIR ETHANOLATE 800 MG PO TABS: 800.0000 mg | ORAL_TABLET | Freq: Every day | ORAL | Status: DC

## 2013-10-06 MED ORDER — RITONAVIR 100 MG PO TABS: 100.0000 mg | ORAL_TABLET | Freq: Every day | ORAL | Status: DC

## 2013-10-06 MED ORDER — EMTRICITABINE-TENOFOVIR DF 200-300 MG PO TABS: 1.0000 | ORAL_TABLET | Freq: Every day | ORAL | Status: DC

## 2013-10-06 NOTE — Patient Instructions (Signed)
We will bring you back in about 2 months to see if  We can put you on JUST TWO PILLS PREZCOBIX and TRUVADA  In meantime continue to take  PREZISTA  NORVIR  AND  TRUVADA  Make appt for Labs in 4-5 months and ANOTHER appt with Dr. Daiva EvesVan Dam in 5 months and we can ensure ADAP renewal with Juan Aguilar

## 2013-10-06 NOTE — Progress Notes (Signed)
  Subjective:    Patient ID: Juan Aguilar, male    DOB: [birth year], [age] y.o.   MRN: [id number]  HPI   38 year old [name] with HIV perfectly  Controlled on prezista norvir and truvada presents to [facility] for followup.  He is perfectly suppressed  But is just today out of meds. I sent new rx to pharmacy. I would like to change PREZCOBIX  And Truvada but while this has been ADAP approved the computers are apparently not yet up to speed.  NO other complaints.    Review of Systems  Constitutional: Negative for fever, chills, diaphoresis, activity change, appetite change, fatigue and unexpected weight change.  HENT: Negative for congestion, rhinorrhea, sinus pressure, sneezing, sore throat and trouble swallowing.   Eyes: Negative for photophobia and visual disturbance.  Respiratory: Negative for cough, chest tightness, shortness of breath, wheezing and stridor.   Cardiovascular: Negative for chest pain, palpitations and leg swelling.  Gastrointestinal: Negative for nausea, vomiting, abdominal pain, diarrhea, constipation, blood in stool, abdominal distention and anal bleeding.  Genitourinary: Negative for dysuria, hematuria, flank pain and difficulty urinating.  Musculoskeletal: Negative for arthralgias, gait problem, joint swelling and myalgias.  Skin: Negative for color change, pallor, rash and wound.  Neurological: Negative for dizziness, tremors, weakness and light-headedness.  Hematological: Negative for adenopathy. Does not bruise/bleed easily.  Psychiatric/Behavioral: Negative for behavioral problems, confusion, sleep disturbance, dysphoric mood, decreased concentration and agitation.       Objective:   Physical Exam  Constitutional: He is oriented to person, place, and time. He appears well-developed and well-nourished. No distress.  HENT:  Head: Normocephalic and atraumatic.  Mouth/Throat: Oropharynx is clear and moist. No oropharyngeal exudate.  Eyes: Conjunctivae and EOM are  normal. Pupils are equal, round, and reactive to light. No scleral icterus.  Neck: Normal range of motion. Neck supple. No JVD present.  Cardiovascular: Normal rate, regular rhythm and normal heart sounds.  Exam reveals no gallop and no friction rub.   No murmur heard. Pulmonary/Chest: Effort normal and breath sounds normal. No respiratory distress. He has no wheezes. He has no rales. He exhibits no tenderness.  Abdominal: He exhibits no distension and no mass. There is no tenderness. There is no rebound and no guarding.  Musculoskeletal: He exhibits no edema and no tenderness.  Lymphadenopathy:    He has no cervical adenopathy.  Neurological: He is alert and oriented to person, place, and time. He exhibits normal muscle tone. Coordination normal.  Skin: Skin is warm and dry. He is not diaphoretic. No erythema. No pallor.  Psychiatric: He has a normal mood and affect. His behavior is normal. Judgment and thought content normal.          Assessment & Plan:   HIV: perfect control continue PRZ/Norvir/Truvada For now until logistics worked out for [employer] Bring back for labs in 4 months and visit in 5 months   In interim will also bring back in 2 months to get onto PREZCOBIX and TRUvada

## 2013-11-17 ENCOUNTER — Other Ambulatory Visit: Payer: Self-pay | Admitting: Licensed Clinical Social Worker

## 2013-11-17 ENCOUNTER — Other Ambulatory Visit: Payer: Self-pay

## 2013-11-17 DIAGNOSIS — B20 Human immunodeficiency virus [HIV] disease: Secondary | ICD-10-CM

## 2013-11-17 MED ORDER — RITONAVIR 100 MG PO TABS: 100.0000 mg | ORAL_TABLET | Freq: Every day | ORAL | Status: DC

## 2013-11-17 MED ORDER — EMTRICITABINE-TENOFOVIR DF 200-300 MG PO TABS: 1.0000 | ORAL_TABLET | Freq: Every day | ORAL | Status: DC

## 2013-11-17 MED ORDER — DARUNAVIR ETHANOLATE 800 MG PO TABS
800.0000 mg | ORAL_TABLET | Freq: Every day | ORAL | Status: DC
Start: 2013-11-17 — End: 2013-12-15

## 2013-12-15 ENCOUNTER — Encounter: Payer: Self-pay | Admitting: Infectious Disease

## 2013-12-15 ENCOUNTER — Ambulatory Visit (INDEPENDENT_AMBULATORY_CARE_PROVIDER_SITE_OTHER): Payer: Self-pay | Admitting: Infectious Disease

## 2013-12-15 VITALS — BP 110/67 | HR 54 | Temp 98.5°F | Wt 138.5 lb

## 2013-12-15 DIAGNOSIS — B20 Human immunodeficiency virus [HIV] disease: Secondary | ICD-10-CM

## 2013-12-15 DIAGNOSIS — M545 Low back pain, unspecified: Secondary | ICD-10-CM

## 2013-12-15 DIAGNOSIS — Z113 Encounter for screening for infections with a predominantly sexual mode of transmission: Secondary | ICD-10-CM

## 2013-12-15 LAB — CBC WITH DIFFERENTIAL/PLATELET
BASOS PCT: 1 % (ref 0–1)
Basophils Absolute: 0.1 10*3/uL (ref 0.0–0.1)
EOS ABS: 0.2 10*3/uL (ref 0.0–0.7)
Eosinophils Relative: 4 % (ref 0–5)
HEMATOCRIT: 42.1 % (ref 39.0–52.0)
Hemoglobin: 14.3 g/dL (ref 13.0–17.0)
Lymphocytes Relative: 31 % (ref 12–46)
Lymphs Abs: 1.6 10*3/uL (ref 0.7–4.0)
MCH: 30.1 pg (ref 26.0–34.0)
MCHC: 34 g/dL (ref 30.0–36.0)
MCV: 88.6 fL (ref 78.0–100.0)
MONO ABS: 0.4 10*3/uL (ref 0.1–1.0)
MONOS PCT: 7 % (ref 3–12)
Neutro Abs: 3 10*3/uL (ref 1.7–7.7)
Neutrophils Relative %: 57 % (ref 43–77)
Platelets: 181 10*3/uL (ref 150–400)
RBC: 4.75 MIL/uL (ref 4.22–5.81)
RDW: 12.9 % (ref 11.5–15.5)
WBC: 5.2 10*3/uL (ref 4.0–10.5)

## 2013-12-15 LAB — COMPLETE METABOLIC PANEL WITH GFR
ALK PHOS: 61 U/L (ref 39–117)
ALT: 13 U/L (ref 0–53)
AST: 18 U/L (ref 0–37)
Albumin: 4.3 g/dL (ref 3.5–5.2)
BILIRUBIN TOTAL: 0.6 mg/dL (ref 0.2–1.2)
BUN: 9 mg/dL (ref 6–23)
CO2: 26 meq/L (ref 19–32)
Calcium: 8.9 mg/dL (ref 8.4–10.5)
Chloride: 106 mEq/L (ref 96–112)
Creat: 0.76 mg/dL (ref 0.50–1.35)
GFR, Est Non African American: >89 mL/min
Glucose, Bld: 88 mg/dL (ref 70–99)
POTASSIUM: 4 meq/L (ref 3.5–5.3)
SODIUM: 136 meq/L (ref 135–145)
TOTAL PROTEIN: 7.2 g/dL (ref 6.0–8.3)

## 2013-12-15 MED ORDER — CYCLOBENZAPRINE HCL 10 MG PO TABS: ORAL_TABLET | ORAL | Status: DC

## 2013-12-15 MED ORDER — DARUNAVIR-COBICISTAT 800-150 MG PO TABS: 1.0000 | ORAL_TABLET | Freq: Every day | ORAL | Status: DC

## 2013-12-15 NOTE — Progress Notes (Signed)
  Subjective:    Patient ID: Juan Aguilar, male    DOB: 05/26/1976, 38 y.o.   MRN: [id number]  HPI   38 year old [name] with HIV perfectly  Controlled on prezista norvir and truvada presents to [facility] for followup.  He is nearly perfectly suppressed  I would like to change PREZCOBIX  And Truvada which we will do today.    Review of Systems  Constitutional: Negative for fever, chills, diaphoresis, activity change, appetite change, fatigue and unexpected weight change.  HENT: Negative for congestion, rhinorrhea, sinus pressure, sneezing, sore throat and trouble swallowing.   Eyes: Negative for photophobia and visual disturbance.  Respiratory: Negative for cough, chest tightness, shortness of breath, wheezing and stridor.   Cardiovascular: Negative for chest pain, palpitations and leg swelling.  Gastrointestinal: Negative for nausea, vomiting, abdominal pain, diarrhea, constipation, blood in stool, abdominal distention and anal bleeding.  Genitourinary: Negative for dysuria, hematuria, flank pain and difficulty urinating.  Musculoskeletal: Negative for arthralgias, gait problem, joint swelling and myalgias.  Skin: Negative for color change, pallor, rash and wound.  Neurological: Negative for dizziness, tremors, weakness and light-headedness.  Hematological: Negative for adenopathy. Does not bruise/bleed easily.  Psychiatric/Behavioral: Negative for behavioral problems, confusion, sleep disturbance, dysphoric mood, decreased concentration and agitation.       Objective:   Physical Exam  Constitutional: He is oriented to person, place, and time. He appears well-developed and well-nourished. No distress.  HENT:  Head: Normocephalic and atraumatic.  Mouth/Throat: Oropharynx is clear and moist. No oropharyngeal exudate.  Eyes: Conjunctivae and EOM are normal. Pupils are equal, round, and reactive to light. No scleral icterus.  Neck: Normal range of motion. Neck supple. No JVD present.   Cardiovascular: Normal rate, regular rhythm and normal heart sounds.  Exam reveals no gallop and no friction rub.   No murmur heard. Pulmonary/Chest: Effort normal and breath sounds normal. No respiratory distress. He has no wheezes. He has no rales. He exhibits no tenderness.  Abdominal: He exhibits no distension and no mass. There is no tenderness. There is no rebound and no guarding.  Musculoskeletal: He exhibits no edema and no tenderness.  Lymphadenopathy:    He has no cervical adenopathy.  Neurological: He is alert and oriented to person, place, and time. He exhibits normal muscle tone. Coordination normal.  Skin: Skin is warm and dry. He is not diaphoretic. No erythema. No pallor.  Psychiatric: He has a normal mood and affect. His behavior is normal. Judgment and thought content normal.          Assessment & Plan:   HIV: perfect control recheck labs today continue PRZ/Norvir/Truvada Change to  PREZCOBIX and Truvada I spent greater than 25 minutes with the patient including greater than 50% of time in face to face counsel of the patient and in coordination of their care.     Muscle spasms: still claims to be suffering from these, refilled his flexeril

## 2013-12-15 NOTE — Patient Instructions (Signed)
Your new regimen will be   Prezcobix ONE PINK PILL ONCE DAILY (see picture)  And  TRUVADA ONE BLUE PILL ONCE A DAY  NO more norvir is needed and Prezista is also not needed

## 2013-12-16 LAB — HIV-1 RNA QUANT-NO REFLEX-BLD
HIV 1 RNA Quant: <20 copies/mL (ref <20)
HIV-1 RNA Quant, Log: <1.3 {Log} (ref <1.30)

## 2013-12-16 LAB — RPR

## 2013-12-16 LAB — T-HELPER CELL (CD4) - (RCID CLINIC ONLY)
CD4 % Helper T Cell: 24 % — ABNORMAL LOW (ref 33–55)
CD4 T Cell Abs: 400 /uL (ref 400–2700)

## 2013-12-16 LAB — URINE CYTOLOGY ANCILLARY ONLY
Chlamydia: NEGATIVE
Neisseria Gonorrhea: NEGATIVE

## 2014-01-05 ENCOUNTER — Encounter: Payer: Self-pay | Admitting: Infectious Disease

## 2014-01-05 ENCOUNTER — Ambulatory Visit (INDEPENDENT_AMBULATORY_CARE_PROVIDER_SITE_OTHER): Payer: Self-pay | Admitting: Infectious Disease

## 2014-01-05 VITALS — BP 106/65 | HR 77 | Temp 98.2°F | Wt 137.0 lb

## 2014-01-05 DIAGNOSIS — B20 Human immunodeficiency virus [HIV] disease: Secondary | ICD-10-CM

## 2014-01-05 NOTE — Patient Instructions (Signed)
Make sure that you get all of your paperwork for ADAP, Juanell FairlyRyan White with [employer]

## 2014-01-05 NOTE — Progress Notes (Signed)
  Subjective:    Patient ID: Juan Aguilar, male    DOB: [birth year], 38 y.o.   MRN: [id number]  HPI   [age] year old [name] with HIV perfectly  Controlled on prezista norvir and truvada presents to [facility] for followup.  He is nearly perfectly suppressed  And changed to PREZCOBIX  And Truvada which we will do today. He likes this new regimen better and has better appetite.    Review of Systems  Constitutional: Negative for fever, chills, diaphoresis, activity change, appetite change, fatigue and unexpected weight change.  HENT: Negative for congestion, rhinorrhea, sinus pressure, sneezing, sore throat and trouble swallowing.   Eyes: Negative for photophobia and visual disturbance.  Respiratory: Negative for cough, chest tightness, shortness of breath, wheezing and stridor.   Cardiovascular: Negative for chest pain, palpitations and leg swelling.  Gastrointestinal: Negative for nausea, vomiting, abdominal pain, diarrhea, constipation, blood in stool, abdominal distention and anal bleeding.  Genitourinary: Negative for dysuria, hematuria, flank pain and difficulty urinating.  Musculoskeletal: Negative for arthralgias, gait problem, joint swelling and myalgias.  Skin: Negative for color change, pallor, rash and wound.  Neurological: Negative for dizziness, tremors, weakness and light-headedness.  Hematological: Negative for adenopathy. Does not bruise/bleed easily.  Psychiatric/Behavioral: Negative for behavioral problems, confusion, sleep disturbance, dysphoric mood, decreased concentration and agitation.       Objective:   Physical Exam  Constitutional: He is oriented to person, place, and time. He appears well-developed and well-nourished. No distress.  HENT:  Head: Normocephalic and atraumatic.  Mouth/Throat: Oropharynx is clear and moist. No oropharyngeal exudate.  Eyes: Conjunctivae and EOM are normal. Pupils are equal, round, and reactive to light. No scleral icterus.  Neck:  Normal range of motion. Neck supple. No JVD present.  Cardiovascular: Normal rate, regular rhythm and normal heart sounds.  Exam reveals no gallop and no friction rub.   No murmur heard. Pulmonary/Chest: Effort normal and breath sounds normal. No respiratory distress. He has no wheezes. He has no rales. He exhibits no tenderness.  Abdominal: He exhibits no distension and no mass. There is no tenderness. There is no rebound and no guarding.  Musculoskeletal: He exhibits no edema and no tenderness.  Lymphadenopathy:    He has no cervical adenopathy.  Neurological: He is alert and oriented to person, place, and time. He exhibits normal muscle tone. Coordination normal.  Skin: Skin is warm and dry. He is not diaphoretic. No erythema. No pallor.  Psychiatric: He has a normal mood and affect. His behavior is normal. Judgment and thought content normal.          Assessment & Plan:   HIV: continue  PREZCOBIX and Truvada   rtc in 6 months  Renew ADAP with Juan Aguilar ([facility]) and RW in July, August

## 2014-01-06 LAB — HIV-1 RNA QUANT-NO REFLEX-BLD: HIV 1 RNA Quant: <20 copies/mL (ref <20)

## 2014-01-06 LAB — T-HELPER CELL (CD4) - (RCID CLINIC ONLY)
CD4 % Helper T Cell: 24 % — ABNORMAL LOW (ref 33–55)
CD4 T Cell Abs: 410 /uL (ref 400–2700)

## 2014-06-22 ENCOUNTER — Other Ambulatory Visit: Payer: Self-pay

## 2014-06-22 ENCOUNTER — Other Ambulatory Visit (INDEPENDENT_AMBULATORY_CARE_PROVIDER_SITE_OTHER): Payer: Self-pay

## 2014-06-22 ENCOUNTER — Ambulatory Visit: Payer: Self-pay

## 2014-06-22 DIAGNOSIS — Z79899 Other long term (current) drug therapy: Secondary | ICD-10-CM

## 2014-06-22 DIAGNOSIS — Z113 Encounter for screening for infections with a predominantly sexual mode of transmission: Secondary | ICD-10-CM

## 2014-06-22 DIAGNOSIS — B20 Human immunodeficiency virus [HIV] disease: Secondary | ICD-10-CM

## 2014-06-22 LAB — CBC WITH DIFFERENTIAL/PLATELET
BASOS ABS: 0 10*3/uL (ref 0.0–0.1)
Basophils Relative: 1 % (ref 0–1)
Eosinophils Absolute: 0.1 10*3/uL (ref 0.0–0.7)
Eosinophils Relative: 2 % (ref 0–5)
HCT: 38 % — ABNORMAL LOW (ref 39.0–52.0)
Hemoglobin: 12.6 g/dL — ABNORMAL LOW (ref 13.0–17.0)
LYMPHS ABS: 1.7 10*3/uL (ref 0.7–4.0)
Lymphocytes Relative: 35 % (ref 12–46)
MCH: 29 pg (ref 26.0–34.0)
MCHC: 33.2 g/dL (ref 30.0–36.0)
MCV: 87.6 fL (ref 78.0–100.0)
Monocytes Absolute: 0.4 10*3/uL (ref 0.1–1.0)
Monocytes Relative: 8 % (ref 3–12)
NEUTROS ABS: 2.6 10*3/uL (ref 1.7–7.7)
NEUTROS PCT: 54 % (ref 43–77)
PLATELETS: 196 10*3/uL (ref 150–400)
RBC: 4.34 MIL/uL (ref 4.22–5.81)
RDW: 12.6 % (ref 11.5–15.5)
WBC: 4.9 10*3/uL (ref 4.0–10.5)

## 2014-06-22 LAB — COMPLETE METABOLIC PANEL WITH GFR
ALBUMIN: 4.2 g/dL (ref 3.5–5.2)
ALT: 11 U/L (ref 0–53)
AST: 16 U/L (ref 0–37)
Alkaline Phosphatase: 52 U/L (ref 39–117)
BILIRUBIN TOTAL: 0.5 mg/dL (ref 0.2–1.2)
BUN: 9 mg/dL (ref 6–23)
CO2: 27 meq/L (ref 19–32)
Calcium: 8.7 mg/dL (ref 8.4–10.5)
Chloride: 105 mEq/L (ref 96–112)
Creat: 0.9 mg/dL (ref 0.50–1.35)
GLUCOSE: 113 mg/dL — AB (ref 70–99)
Potassium: 3.4 mEq/L — ABNORMAL LOW (ref 3.5–5.3)
SODIUM: 139 meq/L (ref 135–145)
Total Protein: 6.8 g/dL (ref 6.0–8.3)

## 2014-06-22 LAB — LIPID PANEL
CHOLESTEROL: 176 mg/dL (ref 0–200)
HDL: 47 mg/dL (ref >39)
LDL Cholesterol: 109 mg/dL — ABNORMAL HIGH (ref 0–99)
Total CHOL/HDL Ratio: 3.7 Ratio
Triglycerides: 102 mg/dL (ref <150)
VLDL: 20 mg/dL (ref 0–40)

## 2014-06-23 LAB — T-HELPER CELL (CD4) - (RCID CLINIC ONLY)
CD4 % Helper T Cell: 25 % — ABNORMAL LOW (ref 33–55)
CD4 T CELL ABS: 410 /uL (ref 400–2700)

## 2014-06-23 LAB — RPR

## 2014-06-27 LAB — HIV-1 RNA QUANT-NO REFLEX-BLD: HIV-1 RNA Quant, Log: <1.3 {Log} (ref <1.30)

## 2014-07-13 ENCOUNTER — Ambulatory Visit (INDEPENDENT_AMBULATORY_CARE_PROVIDER_SITE_OTHER): Payer: Self-pay | Admitting: Infectious Disease

## 2014-07-13 VITALS — BP 105/68 | HR 98 | Temp 98.2°F | Wt 138.0 lb

## 2014-07-13 DIAGNOSIS — B20 Human immunodeficiency virus [HIV] disease: Secondary | ICD-10-CM

## 2014-07-13 DIAGNOSIS — S62631D Displaced fracture of distal phalanx of left index finger, subsequent encounter for fracture with routine healing: Secondary | ICD-10-CM

## 2014-07-13 NOTE — Patient Instructions (Signed)
Thanks for taking such good care of yourself!  I hope you have a Very Happy Thanksgiving!  Please make an appointment to RENEW YOUR ADAP IN January  MAKE AN APPT WITH LABS TO SEE ME IN 6 MONTHS  Keep up the STRONG WORK!

## 2014-07-13 NOTE — Progress Notes (Signed)
  Subjective:    Patient ID: Juan Aguilar, male    DOB: [birth year], 38 y.o.   MRN: [id number]  HPI   38 year old [name] with HIV perfectly  Controlled on prezista norvir and truvada presents to [facility] for followup.  He is  perfectly suppressed on PREZCOBIX  And Truvada   Lab Results  Component Value Date   HIV1RNAQUANT <20 06/22/2014   Lab Results  Component Value Date   CD4TABS 410 06/22/2014   CD4TABS 410 01/05/2014   CD4TABS 400 12/15/2013       Review of Systems  Constitutional: Negative for fever, chills, diaphoresis, activity change, appetite change, fatigue and unexpected weight change.  HENT: Negative for congestion, rhinorrhea, sinus pressure, sneezing, sore throat and trouble swallowing.   Eyes: Negative for photophobia and visual disturbance.  Respiratory: Negative for cough, chest tightness, shortness of breath, wheezing and stridor.   Cardiovascular: Negative for chest pain, palpitations and leg swelling.  Gastrointestinal: Negative for nausea, vomiting, abdominal pain, diarrhea, constipation, blood in stool, abdominal distention and anal bleeding.  Genitourinary: Negative for dysuria, hematuria, flank pain and difficulty urinating.  Musculoskeletal: Negative for myalgias, joint swelling, arthralgias and gait problem.  Skin: Negative for color change, pallor, rash and wound.  Neurological: Negative for dizziness, tremors, weakness and light-headedness.  Hematological: Negative for adenopathy. Does not bruise/bleed easily.  Psychiatric/Behavioral: Negative for behavioral problems, confusion, sleep disturbance, dysphoric mood, decreased concentration and agitation.       Objective:   Physical Exam  Constitutional: He is oriented to person, place, and time. He appears well-developed and well-nourished. No distress.  HENT:  Head: Normocephalic and atraumatic.  Mouth/Throat: Oropharynx is clear and moist. No oropharyngeal exudate.  Eyes: Conjunctivae and EOM  are normal. No scleral icterus.  Neck: Normal range of motion. Neck supple. No JVD present.  Cardiovascular: Normal rate and regular rhythm.   Pulmonary/Chest: Effort normal. No respiratory distress. He has no wheezes.  Abdominal: Soft. He exhibits no distension.  Musculoskeletal: He exhibits no edema or tenderness.  Lymphadenopathy:    He has no cervical adenopathy.  Neurological: He is alert and oriented to person, place, and time. He exhibits normal muscle tone. Coordination normal.  Skin: Skin is warm and dry. He is not diaphoretic. No erythema. No pallor.  Psychiatric: He has a normal mood and affect. His behavior is normal. Judgment and thought content normal.          Assessment & Plan:   HIV: continue  PREZCOBIX and Truvada  Renew ADAP in January, February rtc to see me in 6 months  Finger fracture healed

## 2014-07-19 ENCOUNTER — Other Ambulatory Visit: Payer: Self-pay | Admitting: Infectious Disease

## 2014-07-20 ENCOUNTER — Other Ambulatory Visit: Payer: Self-pay | Admitting: *Deleted

## 2014-07-20 DIAGNOSIS — B20 Human immunodeficiency virus [HIV] disease: Secondary | ICD-10-CM

## 2014-07-20 MED ORDER — EMTRICITABINE-TENOFOVIR DF 200-300 MG PO TABS: 1.0000 | ORAL_TABLET | Freq: Every day | ORAL | Status: DC

## 2014-07-20 MED ORDER — DARUNAVIR-COBICISTAT 800-150 MG PO TABS: 1.0000 | ORAL_TABLET | Freq: Every day | ORAL | Status: DC

## 2014-08-17 ENCOUNTER — Encounter (HOSPITAL_COMMUNITY): Payer: Self-pay

## 2014-08-17 ENCOUNTER — Telehealth: Payer: Self-pay | Admitting: *Deleted

## 2014-08-17 ENCOUNTER — Ambulatory Visit (HOSPITAL_COMMUNITY): Payer: Self-pay | Attending: Family Medicine

## 2014-08-17 ENCOUNTER — Emergency Department (INDEPENDENT_AMBULATORY_CARE_PROVIDER_SITE_OTHER)
Admission: EM | Admit: 2014-08-17 | Discharge: 2014-08-17 | Disposition: A | Discharge status: Home / Self Care | Payer: Self-pay | Source: Home / Self Care | Attending: Family Medicine | Admitting: Family Medicine

## 2014-08-17 DIAGNOSIS — M545 Low back pain, unspecified: Secondary | ICD-10-CM

## 2014-08-17 MED ORDER — CYCLOBENZAPRINE HCL 5 MG PO TABS: 5.0000 mg | ORAL_TABLET | Freq: Three times a day (TID) | ORAL | Status: DC | PRN

## 2014-08-17 MED ORDER — DICLOFENAC POTASSIUM 50 MG PO TABS: 50.0000 mg | ORAL_TABLET | Freq: Three times a day (TID) | ORAL | Status: DC

## 2014-08-17 NOTE — ED Provider Notes (Signed)
CSN: [id number]     Arrival date & time 08/17/14  1456 History   First MD Initiated Contact with Patient 08/17/14 1518     Chief Complaint  Patient presents with  . Back Pain   (Consider location/radiation/quality/duration/timing/severity/associated sxs/prior Treatment) Patient is a 38 y.o. male presenting with back pain. The history is provided by the patient and the spouse.  Back Pain Location:  Generalized Quality:  Stabbing and aching Radiates to:  Does not radiate Pain severity:  Moderate Duration:  3 weeks Progression:  Unchanged Chronicity:  Recurrent Context: lifting heavy objects   Context comment:  Works in [employer], lifting and twisting. Ineffective treatments: back support purchased helps back a little. Associated symptoms: no abdominal pain, no abdominal swelling, no bladder incontinence, no bowel incontinence, no fever, no leg pain, no numbness, no paresthesias and no perianal numbness   Risk factors: no lack of exercise and no vascular disease     Past Medical History  Diagnosis Date  . HIV infection    Past Surgical History  Procedure Laterality Date  . None     History reviewed. No pertinent family history. History  Substance Use Topics  . Smoking status: Former Smoker -- 0.30 packs/day    Types: Cigarettes    Start date: 12/21/2013  . Smokeless tobacco: Never Used     Comment: quit  . Alcohol Use: No    Review of Systems  Constitutional: Negative for fever.  Gastrointestinal: Negative for abdominal pain and bowel incontinence.  Genitourinary: Negative for bladder incontinence.  Musculoskeletal: Positive for back pain.  Neurological: Negative for numbness and paresthesias.    Allergies  Review of patient's allergies indicates no known allergies.  Home Medications   Prior to Admission medications   Medication Sig Start Date End Date Taking? Authorizing Provider  darunavir-cobicistat (PREZCOBIX) 800-150 MG per tablet Take 1 tablet by mouth  daily. 07/20/14  Yes Gardiner Barefootobert W Comer, MD  emtricitabine-tenofovir (TRUVADA) 200-300 MG per tablet Take 1 tablet by mouth daily. 07/20/14  Yes Gardiner Barefootobert W Comer, MD  cyclobenzaprine (FLEXERIL) 10 MG tablet Take one-half to one whole tablet up to three times daily prn 12/15/13   Randall Hissornelius N Van Dam, MD  cyclobenzaprine (FLEXERIL) 5 MG tablet Take 1 tablet (5 mg total) by mouth 3 (three) times daily as needed for muscle spasms. 08/17/14   Linna HoffJames D Kindl, MD  diclofenac (CATAFLAM) 50 MG tablet Take 1 tablet (50 mg total) by mouth 3 (three) times daily. For back pain 08/17/14   Linna HoffJames D Kindl, MD  naproxen (NAPROSYN) 500 MG tablet Take 1 tablet (500 mg total) by mouth 2 (two) times daily with a meal. Patient not taking: Reported on 07/13/2014 11/03/12   Randall Hissornelius N Van Dam, MD   BP 94/56 mmHg  Pulse 78  Temp(Src) 98.7 F (37.1 C) (Oral)  Resp 16  SpO2 100% Physical Exam  Constitutional: He is oriented to person, place, and time. He appears well-developed and well-nourished.  Abdominal: Soft. Bowel sounds are normal.  Musculoskeletal: He exhibits tenderness.       Lumbar back: He exhibits decreased range of motion, tenderness, bony tenderness, pain and spasm. He exhibits no deformity and normal pulse.       Back:  Neurological: He is alert and oriented to person, place, and time.  Skin: Skin is warm and dry.  Nursing note and vitals reviewed.   ED Course  Procedures (including critical care time) Labs Review Labs Reviewed - No data to display  Imaging Review  Dg Lumbar Spine Complete  08/17/2014   CLINICAL DATA:  Three-week history of pain.  Initial encounter.  EXAM: LUMBAR SPINE - COMPLETE 4+ VIEW  COMPARISON:  None.  FINDINGS: Five non rib-bearing lumbar type vertebral bodies are present. The vertebral body heights are well maintained. Vertebral bodies are normally aligned with preservation of the normal lumbar lordosis. No acute fracture or listhesis.  Mild degenerative endplate spurring with  sclerosis present about the L4-5 intervertebral disc space. Mild degenerative disc space narrowing also present at T12-L1. No other significant degenerative changes identified. SI joints symmetric and normal in appearance bilaterally.  Paraspinous soft tissues within normal limits.  IMPRESSION: 1. No acute abnormality within the lumbar spine. 2. 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   Electronically Signed   By: Rise MuBenjamin  McClintock M.D.   On: 08/17/2014 16:12   X-rays reviewed and report per radiologist.   MDM   1. Bilateral low back pain without sciatica        Linna HoffJames D Kindl, MD 08/17/14 [phone]

## 2014-08-17 NOTE — Telephone Encounter (Signed)
Patient's wife left message asking for a call back at 1:38, re: ongoing back pain.  RN opened chart for review before returning call, patient is admitted at Urgent Care for evaluation of back pain. Juan Aguilar, [name], RN

## 2014-08-17 NOTE — ED Notes (Signed)
Per patient and spouse, patient has had pain in in entire back , and tearful at times w pain. Patient bounces all over exam bed during triage interview . NAD denies numbness

## 2014-08-17 NOTE — Discharge Instructions (Signed)
Wear your support as needed, use heat, medicines and stretch as instructed.

## 2014-09-07 ENCOUNTER — Ambulatory Visit: Payer: Self-pay

## 2014-09-20 ENCOUNTER — Ambulatory Visit: Payer: Self-pay

## 2014-12-28 ENCOUNTER — Other Ambulatory Visit (INDEPENDENT_AMBULATORY_CARE_PROVIDER_SITE_OTHER): Payer: Self-pay

## 2014-12-28 ENCOUNTER — Other Ambulatory Visit: Payer: Self-pay | Admitting: Infectious Disease

## 2014-12-28 DIAGNOSIS — B20 Human immunodeficiency virus [HIV] disease: Secondary | ICD-10-CM

## 2014-12-28 LAB — CBC WITH DIFFERENTIAL/PLATELET
BASOS ABS: 0 10*3/uL (ref 0.0–0.1)
BASOS PCT: 0 % (ref 0–1)
EOS ABS: 0.6 10*3/uL (ref 0.0–0.7)
Eosinophils Relative: 11 % — ABNORMAL HIGH (ref 0–5)
HCT: 39.8 % (ref 39.0–52.0)
Hemoglobin: 13.4 g/dL (ref 13.0–17.0)
Lymphocytes Relative: 24 % (ref 12–46)
Lymphs Abs: 1.3 10*3/uL (ref 0.7–4.0)
MCH: 29.3 pg (ref 26.0–34.0)
MCHC: 33.7 g/dL (ref 30.0–36.0)
MCV: 87.1 fL (ref 78.0–100.0)
MONOS PCT: 11 % (ref 3–12)
MPV: 10.1 fL (ref 8.6–12.4)
Monocytes Absolute: 0.6 10*3/uL (ref 0.1–1.0)
NEUTROS ABS: 2.9 10*3/uL (ref 1.7–7.7)
NEUTROS PCT: 54 % (ref 43–77)
PLATELETS: 212 10*3/uL (ref 150–400)
RBC: 4.57 MIL/uL (ref 4.22–5.81)
RDW: 12.5 % (ref 11.5–15.5)
WBC: 5.3 10*3/uL (ref 4.0–10.5)

## 2014-12-28 LAB — COMPLETE METABOLIC PANEL WITH GFR
ALK PHOS: 69 U/L (ref 39–117)
ALT: 19 U/L (ref 0–53)
AST: 20 U/L (ref 0–37)
Albumin: 3.9 g/dL (ref 3.5–5.2)
BILIRUBIN TOTAL: 0.3 mg/dL (ref 0.2–1.2)
BUN: 6 mg/dL (ref 6–23)
CO2: 21 meq/L (ref 19–32)
CREATININE: 0.72 mg/dL (ref 0.50–1.35)
Calcium: 8.8 mg/dL (ref 8.4–10.5)
Chloride: 107 mEq/L (ref 96–112)
GLUCOSE: 90 mg/dL (ref 70–99)
Potassium: 3.7 mEq/L (ref 3.5–5.3)
SODIUM: 137 meq/L (ref 135–145)
Total Protein: 8.1 g/dL (ref 6.0–8.3)

## 2014-12-28 LAB — HEPATITIS C ANTIBODY: HCV AB: NEGATIVE

## 2014-12-28 LAB — LIPID PANEL
Cholesterol: 161 mg/dL (ref 0–200)
HDL: 34 mg/dL — ABNORMAL LOW (ref >=40)
LDL CALC: 99 mg/dL (ref 0–99)
Total CHOL/HDL Ratio: 4.7 Ratio
Triglycerides: 138 mg/dL (ref <150)
VLDL: 28 mg/dL (ref 0–40)

## 2014-12-29 ENCOUNTER — Other Ambulatory Visit: Payer: Self-pay | Admitting: *Deleted

## 2014-12-29 DIAGNOSIS — B20 Human immunodeficiency virus [HIV] disease: Secondary | ICD-10-CM

## 2014-12-29 LAB — URINE CYTOLOGY ANCILLARY ONLY
CHLAMYDIA, DNA PROBE: NEGATIVE
Neisseria Gonorrhea: NEGATIVE

## 2014-12-29 LAB — RPR

## 2014-12-29 LAB — T-HELPER CELL (CD4) - (RCID CLINIC ONLY)
CD4 % Helper T Cell: 17 % — ABNORMAL LOW (ref 33–55)
CD4 T Cell Abs: 210 /uL — ABNORMAL LOW (ref 400–2700)

## 2014-12-29 MED ORDER — EMTRICITABINE-TENOFOVIR DF 200-300 MG PO TABS: 1.0000 | ORAL_TABLET | Freq: Every day | ORAL | Status: DC

## 2014-12-29 MED ORDER — DARUNAVIR-COBICISTAT 800-150 MG PO TABS: 1.0000 | ORAL_TABLET | Freq: Every day | ORAL | Status: DC

## 2014-12-29 NOTE — Telephone Encounter (Signed)
ADAP Application 

## 2014-12-31 LAB — HIV-1 RNA QUANT-NO REFLEX-BLD
HIV 1 RNA Quant: 35489 copies/mL — ABNORMAL HIGH (ref <20)
HIV-1 RNA Quant, Log: 4.55 {Log} — ABNORMAL HIGH (ref <1.30)

## 2015-01-02 ENCOUNTER — Telehealth: Payer: Self-pay | Admitting: Infectious Disease

## 2015-01-02 DIAGNOSIS — B20 Human immunodeficiency virus [HIV] disease: Secondary | ICD-10-CM

## 2015-01-02 NOTE — Addendum Note (Signed)
Addended by: Mariea ClontsGREEN, Rayma Hegg D on: 01/02/2015 10:39 AM   Modules accepted: Orders

## 2015-01-02 NOTE — Telephone Encounter (Signed)
Spoke with Clydie BraunKaren. She will call the lab.

## 2015-01-02 NOTE — Telephone Encounter (Signed)
Add on conventional HIV genotype

## 2015-01-11 ENCOUNTER — Other Ambulatory Visit: Payer: Self-pay | Admitting: *Deleted

## 2015-01-11 ENCOUNTER — Encounter: Payer: Self-pay | Admitting: Infectious Disease

## 2015-01-11 ENCOUNTER — Ambulatory Visit (INDEPENDENT_AMBULATORY_CARE_PROVIDER_SITE_OTHER): Payer: Self-pay | Admitting: Infectious Disease

## 2015-01-11 VITALS — BP 106/74 | HR 69 | Temp 98.3°F | Wt 141.0 lb

## 2015-01-11 DIAGNOSIS — Z91199 Patient's noncompliance with other medical treatment and regimen due to unspecified reason: Secondary | ICD-10-CM | POA: Insufficient documentation

## 2015-01-11 DIAGNOSIS — B20 Human immunodeficiency virus [HIV] disease: Secondary | ICD-10-CM | POA: Insufficient documentation

## 2015-01-11 DIAGNOSIS — Z9119 Patient's noncompliance with other medical treatment and regimen: Secondary | ICD-10-CM

## 2015-01-11 HISTORY — DX: Patient's noncompliance with other medical treatment and regimen due to unspecified reason: Z91.199

## 2015-01-11 HISTORY — DX: Patient's noncompliance with other medical treatment and regimen: Z91.19

## 2015-01-11 HISTORY — DX: Human immunodeficiency virus (HIV) disease: B20

## 2015-01-11 LAB — HIV-1 GENOTYPR PLUS

## 2015-01-11 MED ORDER — DOLUTEGRAVIR SODIUM 50 MG PO TABS: 50.0000 mg | ORAL_TABLET | Freq: Every day | ORAL | Status: DC

## 2015-01-11 MED ORDER — DARUNAVIR-COBICISTAT 800-150 MG PO TABS: 1.0000 | ORAL_TABLET | Freq: Every day | ORAL | Status: DC

## 2015-01-11 MED ORDER — EMTRICITABINE-TENOFOVIR DF 200-300 MG PO TABS: 1.0000 | ORAL_TABLET | Freq: Every day | ORAL | Status: DC

## 2015-01-11 MED ORDER — ONDANSETRON HCL 4 MG PO TABS: 4.0000 mg | ORAL_TABLET | Freq: Three times a day (TID) | ORAL | Status: DC | PRN

## 2015-01-11 NOTE — Progress Notes (Signed)
   Subjective:    Patient ID: Juan Aguilar, male    DOB: 1976-03-25, 39 y.o.   MRN: [id number]  HPI  39 year old [name] who upon review of past HIV genotypes has EXTENSIVE NNRTI, PI and even NRTI mutations who HAD been perfectly controlled on PREZCOBIX and Truvada but stopped taking these meds because he felt they were "making him feel tired." when he resumed them he was not able to tolerate them well due to vomiting them.  He has NOT renewed ADAP  He returns to clinic for followup.  Review of Systems  Constitutional: Positive for appetite change and fatigue. Negative for fever, chills, diaphoresis, activity change and unexpected weight change.  HENT: Negative for congestion, rhinorrhea, sinus pressure, sneezing, sore throat and trouble swallowing.   Eyes: Negative for photophobia and visual disturbance.  Respiratory: Negative for cough, chest tightness, shortness of breath, wheezing and stridor.   Cardiovascular: Negative for chest pain and leg swelling.  Gastrointestinal: Positive for nausea and vomiting. Negative for abdominal pain, diarrhea, constipation, blood in stool, abdominal distention and anal bleeding.  Genitourinary: Negative for dysuria, hematuria, flank pain and difficulty urinating.  Musculoskeletal: Negative for myalgias, back pain, joint swelling, arthralgias and gait problem.  Skin: Negative for color change, pallor, rash and wound.  Neurological: Negative for dizziness, tremors, weakness and light-headedness.  Hematological: Negative for adenopathy. Does not bruise/bleed easily.  Psychiatric/Behavioral: Negative for behavioral problems, confusion, sleep disturbance, dysphoric mood, decreased concentration and agitation.       Objective:   Physical Exam  Constitutional: He is oriented to person, place, and time. He appears well-developed and well-nourished.  HENT:  Head: Normocephalic and atraumatic.  Eyes: Conjunctivae and EOM are normal.  Neck: Normal range  of motion. Neck supple.  Cardiovascular: Normal rate and regular rhythm.   Pulmonary/Chest: Effort normal. No respiratory distress. He has no wheezes.  Abdominal: Soft. He exhibits no distension.  Musculoskeletal: Normal range of motion. He exhibits no edema or tenderness.  Neurological: He is alert and oriented to person, place, and time.  Skin: Skin is warm and dry. No rash noted. No erythema. No pallor.  Psychiatric: He has a normal mood and affect.          Assessment & Plan:   HIV: MDR HIV, and genotypes reviewed cumulatively. Given his extensive PI R (but no DRAM) and only on TWO active agents I am adding a third in form of Tivicay  He is to return to clinic in one month for recheck VL and CD4 (latter has plummeted)  I spent greater than 40 minutes with the patient including greater than 50% of time in face to face counsel of the patient re his HIV  and in coordination of their care.   Noncompliance: see above

## 2015-01-11 NOTE — Patient Instructions (Signed)
Every day take  ZOfran 4mg  ONE HOUR BEFORE taking your THREE Medicines which are  PREZCOBIX (PINK PILL)  TRUVADA (BLUE PILL)  AND   TIVICAY yELLOW PILL

## 2015-01-11 NOTE — Progress Notes (Signed)
Patient ID: Juan Aguilar, male   DOB: 30-Aug-1975, 39 y.o.   MRN: [id number] HPI: Juan Aguilar is a 39 y.o. male who is here for his HIV f/u.  Allergies: No Known Allergies  Vitals: Temp: 98.3 F (36.8 C) (05/25 1030) Temp Source: Oral (05/25 1030) BP: 106/74 mmHg (05/25 1030) Pulse Rate: 69 (05/25 1030)  Past Medical History: Past Medical History  Diagnosis Date  . HIV infection     Social History: History   Social History  . Marital Status: Married    Spouse Name: N/A  . Number of Children: N/A  . Years of Education: N/A   Social History Main Topics  . Smoking status: Former Smoker -- 0.30 packs/day    Types: Cigarettes    Start date: 12/21/2013  . Smokeless tobacco: Never Used     Comment: quit  . Alcohol Use: No  . Drug Use: No  . Sexual Activity:    Partners: Female     Comment: pt. given condoms   Other Topics Concern  . None   Social History Narrative    Previous Regimen:   Current Regimen: Prezcobix + Truvada  Labs: HIV 1 RNA QUANT (copies/mL)  Date Value  12/28/2014 35489*  06/22/2014 <20  01/05/2014 <20   CD4 T CELL ABS (/uL)  Date Value  12/28/2014 210*  06/22/2014 410  01/05/2014 410   HEP B S AB (no units)  Date Value  01/08/2006 negative   HEPATITIS B SURFACE AG (no units)  Date Value  01/08/2006 negative   HCV AB (no units)  Date Value  12/28/2014 NEGATIVE    CrCl: CrCl cannot be calculated (Unknown ideal weight.).  Lipids:    Component Value Date/Time   CHOL 161 12/28/2014 1115   TRIG 138 12/28/2014 1115   HDL 34* 12/28/2014 1115   CHOLHDL 4.7 12/28/2014 1115   VLDL 28 12/28/2014 1115   LDLCALC 99 12/28/2014 1115   HIV Genotype Composite Data Genotype Dates:  Mutations in Bold impact drug susceptibility RT Mutations L74V, M184V, K103N, Y181C, Y188L, P225H  PI Mutations M46L, I54V, V82A, L10I, K20IM, L24I, E35G, T74S  Integrase Mutations    Interpretation of Genotype Data per [employer] HIV  Database Nucleoside RTIs  lamivudine (3TC) High-level resistance abacavir (ABC) High-level resistance zidovudine (AZT) Susceptible stavudine (D4T) Susceptible didanosine (DDI) High-level resistance emtricitabine (FTC) High-level resistance tenofovir (TDF) Susceptible   Non-Nucleoside RTIs  efavirenz (EFV) High-level resistance etravirine (ETR) Intermediate resistance nevirapine (NVP) High-level resistance rilpivirine (RPV) High-level resistance   Protease Inhibitors  atazanavir/r (ATV/r) High-level resistance darunavir/r (DRV/r) Susceptible fosamprenavir/r (FPV/r) High-level resistance indinavir/r (IDV/r) High-level resistance lopinavir/r (LPV/r) High-level resistance nelfinavir (NFV) High-level resistance saquinavir/r (SQV/r) Intermediate resistance tipranavir/r (TPV/r) Intermediate resistance   Integrase Inhibitors      Assessment: 39 yo who is here for his f/u. His VL has been back up. He is complaining of nausea with his meds. He has a tremendous amount of resistance. See table up top. His ADAP has not been renew yet. We are trying to craft up a regimen for him that would be easier for his nausea but he has way too much resistance for anything feasible. We are going to cont his current regimen and add on Tivicay. [facility] while awaiting for ADAP with Marcelino DusterMichelle.    Recommendations: Cont Prezcobix/Truvada Tivicay 50mg  PO qday [address] process  Ulyses Southwardham, Minh MomeyerQuang, PharmD Clinical Infectious Disease Pharmacist [facility] 01/11/2015, 11:38 AM

## 2015-01-17 ENCOUNTER — Telehealth: Payer: Self-pay | Admitting: Licensed Clinical Social Worker

## 2015-01-17 NOTE — Telephone Encounter (Signed)
Medication arrived today from [facility], called patient

## 2015-02-08 NOTE — Progress Notes (Signed)
Notified [facility]. Confirmed regimen, prescriptions on file for pick up. Antonious Omahoney M, RN  

## 2015-02-09 ENCOUNTER — Other Ambulatory Visit (INDEPENDENT_AMBULATORY_CARE_PROVIDER_SITE_OTHER): Payer: Self-pay

## 2015-02-09 DIAGNOSIS — B20 Human immunodeficiency virus [HIV] disease: Secondary | ICD-10-CM

## 2015-02-09 DIAGNOSIS — Z113 Encounter for screening for infections with a predominantly sexual mode of transmission: Secondary | ICD-10-CM

## 2015-02-09 LAB — CBC WITH DIFFERENTIAL/PLATELET
BASOS ABS: 0 10*3/uL (ref 0.0–0.1)
Basophils Relative: 1 % (ref 0–1)
EOS PCT: 10 % — AB (ref 0–5)
Eosinophils Absolute: 0.5 10*3/uL (ref 0.0–0.7)
HCT: 39.4 % (ref 39.0–52.0)
HEMOGLOBIN: 13.1 g/dL (ref 13.0–17.0)
LYMPHS PCT: 30 % (ref 12–46)
Lymphs Abs: 1.4 10*3/uL (ref 0.7–4.0)
MCH: 29.6 pg (ref 26.0–34.0)
MCHC: 33.2 g/dL (ref 30.0–36.0)
MCV: 88.9 fL (ref 78.0–100.0)
MPV: 10.3 fL (ref 8.6–12.4)
Monocytes Absolute: 0.4 10*3/uL (ref 0.1–1.0)
Monocytes Relative: 9 % (ref 3–12)
NEUTROS ABS: 2.4 10*3/uL (ref 1.7–7.7)
NEUTROS PCT: 50 % (ref 43–77)
PLATELETS: 187 10*3/uL (ref 150–400)
RBC: 4.43 MIL/uL (ref 4.22–5.81)
RDW: 13.4 % (ref 11.5–15.5)
WBC: 4.7 10*3/uL (ref 4.0–10.5)

## 2015-02-09 LAB — COMPLETE METABOLIC PANEL WITH GFR
ALBUMIN: 3.8 g/dL (ref 3.5–5.2)
ALT: 17 U/L (ref 0–53)
AST: 23 U/L (ref 0–37)
Alkaline Phosphatase: 63 U/L (ref 39–117)
BUN: 7 mg/dL (ref 6–23)
CO2: 21 meq/L (ref 19–32)
Calcium: 8.8 mg/dL (ref 8.4–10.5)
Chloride: 105 mEq/L (ref 96–112)
Creat: 0.99 mg/dL (ref 0.50–1.35)
GLUCOSE: 118 mg/dL — AB (ref 70–99)
Potassium: 4 mEq/L (ref 3.5–5.3)
Sodium: 140 mEq/L (ref 135–145)
Total Bilirubin: 0.5 mg/dL (ref 0.2–1.2)
Total Protein: 8.1 g/dL (ref 6.0–8.3)

## 2015-02-09 LAB — RPR

## 2015-02-10 LAB — T-HELPER CELL (CD4) - (RCID CLINIC ONLY)
CD4 % Helper T Cell: 15 % — ABNORMAL LOW (ref 33–55)
CD4 T Cell Abs: 220 /uL — ABNORMAL LOW (ref 400–2700)

## 2015-02-12 LAB — HIV-1 RNA QUANT-NO REFLEX-BLD
HIV 1 RNA Quant: 126 copies/mL — ABNORMAL HIGH (ref <20)
HIV-1 RNA QUANT, LOG: 2.1 {Log} — AB (ref <1.30)

## 2015-02-15 LAB — HLA B*5701: HLA-B*5701 w/rflx HLA-B High: NEGATIVE

## 2015-03-01 ENCOUNTER — Ambulatory Visit (INDEPENDENT_AMBULATORY_CARE_PROVIDER_SITE_OTHER): Payer: Self-pay | Admitting: Infectious Disease

## 2015-03-01 ENCOUNTER — Encounter: Payer: Self-pay | Admitting: Infectious Disease

## 2015-03-01 VITALS — BP 109/70 | HR 76 | Temp 98.5°F | Wt 137.0 lb

## 2015-03-01 DIAGNOSIS — Z91199 Patient's noncompliance with other medical treatment and regimen due to unspecified reason: Secondary | ICD-10-CM

## 2015-03-01 DIAGNOSIS — Z9119 Patient's noncompliance with other medical treatment and regimen: Secondary | ICD-10-CM

## 2015-03-01 DIAGNOSIS — B20 Human immunodeficiency virus [HIV] disease: Secondary | ICD-10-CM

## 2015-03-01 MED ORDER — EMTRICITABINE-TENOFOVIR AF 200-25 MG PO TABS: 1.0000 | ORAL_TABLET | Freq: Every day | ORAL | Status: DC

## 2015-03-01 NOTE — Progress Notes (Signed)
Subjective:    Patient ID: Juan Aguilar, male    DOB: [birth year], 39 y.o.   MRN: [id number]  HPI  Mr. Blanchfield is a 39 y/o male who was diagnosed with advanced HIV disease in 2007 when immig[place] the [place]. He unfortunately has significant resistance as shown below. At his last visit Tivicay was added to his regimen of Prezcobix and Truvada. He has been taking his medicines everyday without missing any doses and is tolerating this regimen well. His last CD4 count in June was 220/VL 126. He reports that he has been feeling well and has no current complaints.   Patient Active Problem List   Diagnosis Date Noted  . AIDS 01/11/2015  . Noncompliance 01/11/2015  . Lower back pain 11/03/2012  . Complicated laceration of finger 11/01/2011  . Closed displaced fracture of distal phalanx of left index finger 11/01/2011  . Human immunodeficiency virus (HIV) disease 03/05/2007    Outpatient Encounter Prescriptions as of 03/01/2015  Medication Sig  . cyclobenzaprine (FLEXERIL) 10 MG tablet Take one-half to one whole tablet up to three times daily prn  . darunavir-cobicistat (PREZCOBIX) 800-150 MG per tablet Take 1 tablet by mouth daily.  . dolutegravir (TIVICAY) 50 MG tablet Take 1 tablet (50 mg total) by mouth daily.  . ondansetron (ZOFRAN) 4 MG tablet Take 1 tablet (4 mg total) by mouth every 8 (eight) hours as needed for nausea or vomiting.  . [DISCONTINUED] emtricitabine-tenofovir (TRUVADA) 200-300 MG per tablet Take 1 tablet by mouth daily.  Marland Kitchen emtricitabine-tenofovir AF (DESCOVY) 200-25 MG per tablet Take 1 tablet by mouth daily.   No facility-administered encounter medications on file as of 03/01/2015.     HIV Genotype Composite Data Genotype Dates:  Mutations in Bold impact drug susceptibility RT Mutations L74V, M184V, K103N, Y181C, Y188L, P225H  PI Mutations M46L, I54V, V82A, L10I, K20IM, L24I, E35G, T74S  Integrase Mutations    Interpretation of Genotype Data per  [employer] HIV Database Nucleoside RTIs  lamivudine (3TC)High-level resistance abacavir (ABC)High-level resistance zidovudine (AZT)Susceptible stavudine (D4T)Susceptible didanosine (DDI)High-level resistance emtricitabine (FTC)High-level resistance tenofovir (TDF)Susceptible   Non-Nucleoside RTIs  efavirenz (EFV)High-level resistance etravirine (ETR)Intermediate resistance nevirapine (NVP)High-level resistance rilpivirine (RPV)High-level resistance   Protease Inhibitors  atazanavir/r (ATV/r)High-level resistance darunavir/r (DRV/r)Susceptible fosamprenavir/r (FPV/r)High-level resistance indinavir/r (IDV/r)High-level resistance lopinavir/r (LPV/r)High-level resistance nelfinavir (NFV)High-level resistance saquinavir/r (SQV/r)Intermediate resistance tipranavir/r (TPV/r)Intermediate resistance   Integrase Inhibitors             Review of Systems  Constitutional: Negative for fever, chills, diaphoresis, appetite change, fatigue and unexpected weight change.  HENT: Negative for dental problem, mouth sores and trouble swallowing.   Eyes: Negative for photophobia and visual disturbance.  Respiratory: Negative for cough and shortness of breath.   Cardiovascular: Negative for chest pain and leg swelling.  Gastrointestinal: Negative for nausea, vomiting, abdominal pain, diarrhea, constipation and abdominal distention.  Genitourinary: Negative for difficulty urinating.  Skin: Negative for rash and wound.  Neurological: Negative for dizziness, syncope, weakness and numbness.  Hematological: Negative for adenopathy.  Psychiatric/Behavioral: Negative for behavioral problems, sleep disturbance and agitation. The patient is not nervous/anxious.        Objective:   Physical Exam  Constitutional: He is oriented to person,  place, and time. He appears well-developed and well-nourished.  HENT:  Head: Normocephalic and atraumatic.  Mouth/Throat: No oropharyngeal exudate.  Eyes: Conjunctivae are normal. Pupils are equal, round, and reactive to light.  Neck: Normal range of motion. Neck supple.  Cardiovascular: Normal rate and regular rhythm.  Pulmonary/Chest: Effort normal and breath sounds normal.  Abdominal: Soft. Bowel sounds are normal.  Musculoskeletal: Normal range of motion.  Lymphadenopathy:    He has no cervical adenopathy.  Neurological: He is alert and oriented to person, place, and time.  Skin: Skin is warm and dry. No rash noted.  Psychiatric: He has a normal mood and affect. His behavior is normal. Judgment and thought content normal.     Lab Results  Component Value Date   CD4TABS 220* 02/09/2015   CD4TABS 210* 12/28/2014   CD4TABS 410 06/22/2014   Lab Results  Component Value Date   HIV1RNAQUANT 126* 02/09/2015        Assessment & Plan:  HIV: Viral load is trending down since adding Tivicay to his regimen.  - Recheck labs today - Creatinine has trended up. Will switch Truvada to Descovey. - Informed him to make an appt for ADAP renewal. - RTC in 2 months

## 2015-03-02 LAB — HIV-1 RNA QUANT-NO REFLEX-BLD
HIV 1 RNA Quant: <20 copies/mL (ref <20)
HIV-1 RNA Quant, Log: <1.3 {Log} (ref <1.30)

## 2015-03-07 ENCOUNTER — Ambulatory Visit: Payer: Self-pay

## 2015-05-03 ENCOUNTER — Ambulatory Visit: Payer: Self-pay | Admitting: Infectious Disease

## 2015-05-17 ENCOUNTER — Ambulatory Visit (INDEPENDENT_AMBULATORY_CARE_PROVIDER_SITE_OTHER): Payer: Self-pay | Admitting: Infectious Disease

## 2015-05-17 ENCOUNTER — Encounter: Payer: Self-pay | Admitting: Infectious Disease

## 2015-05-17 VITALS — BP 118/76 | HR 77 | Temp 98.6°F | Wt 139.0 lb

## 2015-05-17 DIAGNOSIS — Z23 Encounter for immunization: Secondary | ICD-10-CM

## 2015-05-17 DIAGNOSIS — Z91199 Patient's noncompliance with other medical treatment and regimen due to unspecified reason: Secondary | ICD-10-CM

## 2015-05-17 DIAGNOSIS — Z9119 Patient's noncompliance with other medical treatment and regimen: Secondary | ICD-10-CM

## 2015-05-17 DIAGNOSIS — B20 Human immunodeficiency virus [HIV] disease: Secondary | ICD-10-CM

## 2015-05-17 LAB — COMPLETE METABOLIC PANEL WITH GFR
ALBUMIN: 4.4 g/dL (ref 3.6–5.1)
ALK PHOS: 65 U/L (ref 40–115)
ALT: 18 U/L (ref 9–46)
AST: 22 U/L (ref 10–40)
BUN: 9 mg/dL (ref 7–25)
CALCIUM: 9.9 mg/dL (ref 8.6–10.3)
CO2: 30 mmol/L (ref 20–31)
CREATININE: 0.85 mg/dL (ref 0.60–1.35)
Chloride: 101 mmol/L (ref 98–110)
GFR, Est African American: >89 mL/min (ref >=60)
GFR, Est Non African American: >89 mL/min (ref >=60)
Glucose, Bld: 82 mg/dL (ref 65–99)
Potassium: 4 mmol/L (ref 3.5–5.3)
Sodium: 136 mmol/L (ref 135–146)
TOTAL PROTEIN: 8.1 g/dL (ref 6.1–8.1)
Total Bilirubin: 0.8 mg/dL (ref 0.2–1.2)

## 2015-05-17 LAB — CBC WITH DIFFERENTIAL/PLATELET
BASOS PCT: 1 % (ref 0–1)
Basophils Absolute: 0 10*3/uL (ref 0.0–0.1)
EOS PCT: 2 % (ref 0–5)
Eosinophils Absolute: 0.1 10*3/uL (ref 0.0–0.7)
HEMATOCRIT: 42.6 % (ref 39.0–52.0)
Hemoglobin: 14.4 g/dL (ref 13.0–17.0)
Lymphocytes Relative: 42 % (ref 12–46)
Lymphs Abs: 2.1 10*3/uL (ref 0.7–4.0)
MCH: 30 pg (ref 26.0–34.0)
MCHC: 33.8 g/dL (ref 30.0–36.0)
MCV: 88.8 fL (ref 78.0–100.0)
MONO ABS: 0.6 10*3/uL (ref 0.1–1.0)
MPV: 11 fL (ref 8.6–12.4)
Monocytes Relative: 13 % — ABNORMAL HIGH (ref 3–12)
Neutro Abs: 2.1 10*3/uL (ref 1.7–7.7)
Neutrophils Relative %: 42 % — ABNORMAL LOW (ref 43–77)
Platelets: 213 10*3/uL (ref 150–400)
RBC: 4.8 MIL/uL (ref 4.22–5.81)
RDW: 12.4 % (ref 11.5–15.5)
WBC: 4.9 10*3/uL (ref 4.0–10.5)

## 2015-05-17 MED ORDER — DARUNAVIR-COBICISTAT 800-150 MG PO TABS: 1.0000 | ORAL_TABLET | Freq: Every day | ORAL | Status: DC

## 2015-05-17 MED ORDER — DOLUTEGRAVIR SODIUM 50 MG PO TABS: 50.0000 mg | ORAL_TABLET | Freq: Every day | ORAL | Status: DC

## 2015-05-17 MED ORDER — EMTRICITABINE-TENOFOVIR AF 200-25 MG PO TABS: 1.0000 | ORAL_TABLET | Freq: Every day | ORAL | Status: DC

## 2015-05-17 NOTE — Progress Notes (Signed)
Subjective:    Patient ID: Juan Aguilar, male    DOB: March 08, 1976, 39 y.o.   MRN: [id number]  HPI   39 year old [name] who upon review of past HIV genotypes has EXTENSIVE NNRTI, PI and even NRTI mutations who HAD been perfectly controlled on PREZCOBIX and Truvada but had stopped taking these meds because he felt they were "making him feel tired." when he resumed them he was not able to tolerate them well due to vomiting them for a while but eventually was able to take them.  During his last visit we strengthen his regimen by adding TIVICAY to his PREZCOBIX and exchanging his Truvada to DESCOVY. He is remained on this regimen and likes it quite a bit and feels it is been helping his appetite as well. His most recent blood work was done prior to this change and showed undetectable viral load and healthy CD4 count. He has renewed his ADAP and returns for follow-up. We'll check labs today and if there encouraging we'll bring him back in the wintertime to renew his ADAP.  Past Medical History  Diagnosis Date  . HIV infection   . AIDS 01/11/2015  . Noncompliance 01/11/2015    Past Surgical History  Procedure Laterality Date  . None      No family history on file.  Dad HTN   Social History   Social History  . Marital Status: Married    Spouse Name: N/A  . Number of Children: N/A  . Years of Education: N/A   Social History Main Topics  . Smoking status: Former Smoker -- 0.30 packs/day    Types: Cigarettes    Start date: 12/21/2013  . Smokeless tobacco: Never Used     Comment: quit  . Alcohol Use: No  . Drug Use: No  . Sexual Activity:    Partners: Female     Comment: pt. given condoms   Other Topics Concern  . None   Social History Narrative    No Known Allergies   Current outpatient prescriptions:  .  darunavir-cobicistat (PREZCOBIX) 800-150 MG tablet, Take 1 tablet by mouth daily., Disp: 30 tablet, Rfl: 11 .  dolutegravir (TIVICAY) 50 MG tablet, Take 1 tablet  (50 mg total) by mouth daily., Disp: 30 tablet, Rfl: 11 .  emtricitabine-tenofovir AF (DESCOVY) 200-25 MG tablet, Take 1 tablet by mouth daily., Disp: 30 tablet, Rfl: 11   Review of Systems  Constitutional: Negative for fever, chills, diaphoresis, activity change and unexpected weight change.  HENT: Negative for congestion, rhinorrhea, sinus pressure, sneezing, sore throat and trouble swallowing.   Eyes: Negative for photophobia and visual disturbance.  Respiratory: Negative for cough, chest tightness, shortness of breath, wheezing and stridor.   Cardiovascular: Negative for chest pain and leg swelling.  Gastrointestinal: Negative for nausea, vomiting, abdominal pain, diarrhea, constipation, blood in stool, abdominal distention and anal bleeding.  Genitourinary: Negative for dysuria, hematuria, flank pain and difficulty urinating.  Musculoskeletal: Negative for myalgias, back pain, joint swelling, arthralgias and gait problem.  Skin: Negative for color change, pallor, rash and wound.  Neurological: Negative for dizziness, tremors, weakness and light-headedness.  Hematological: Negative for adenopathy. Does not bruise/bleed easily.  Psychiatric/Behavioral: Negative for behavioral problems, confusion, sleep disturbance, dysphoric mood, decreased concentration and agitation.       Objective:   Physical Exam  Constitutional: He is oriented to person, place, and time. He appears well-developed and well-nourished.  HENT:  Head: Normocephalic and atraumatic.  Eyes: Conjunctivae and EOM are  normal.  Neck: Normal range of motion. Neck supple.  Cardiovascular: Normal rate and regular rhythm.   Pulmonary/Chest: Effort normal. No respiratory distress. He has no wheezes.  Abdominal: Soft. He exhibits no distension.  Musculoskeletal: Normal range of motion. He exhibits no edema or tenderness.  Neurological: He is alert and oriented to person, place, and time.  Skin: Skin is warm and dry. No rash  noted. No erythema. No pallor.  Psychiatric: He has a normal mood and affect. His speech is normal and behavior is normal. Judgment and thought content normal. Cognition and memory are normal.          Assessment & Plan:   HIV: MDR HIV, and genotypes reviewed cumulatively. Given his extensive PI R (but no DRAM): Continue his salvage regimen of PREZCOBIX DESCOVY and TIVICAY, checking viral load and CD4 count other labs today and having come back in 4 months time.  Smoking: He has quit

## 2015-05-18 LAB — HIV RNA, RTPCR W/R GT (RTI, PI,INT)
HIV 1 RNA Quant: 21 copies/mL (ref <20)
HIV-1 RNA Quant, Log: 1.32 {Log} — ABNORMAL HIGH (ref <1.30)

## 2015-05-18 LAB — RPR

## 2015-05-18 LAB — URINE CYTOLOGY ANCILLARY ONLY
Chlamydia: NEGATIVE
Neisseria Gonorrhea: NEGATIVE

## 2015-05-19 LAB — T-HELPER CELL (CD4) - (RCID CLINIC ONLY)
CD4 % Helper T Cell: 16 % — ABNORMAL LOW (ref 33–55)
CD4 T Cell Abs: 330 /uL — ABNORMAL LOW (ref 400–2700)

## 2015-06-07 ENCOUNTER — Other Ambulatory Visit: Payer: Self-pay

## 2015-09-13 ENCOUNTER — Other Ambulatory Visit (INDEPENDENT_AMBULATORY_CARE_PROVIDER_SITE_OTHER): Payer: Self-pay

## 2015-09-13 DIAGNOSIS — B20 Human immunodeficiency virus [HIV] disease: Secondary | ICD-10-CM

## 2015-09-13 DIAGNOSIS — Z113 Encounter for screening for infections with a predominantly sexual mode of transmission: Secondary | ICD-10-CM

## 2015-09-13 LAB — CBC WITH DIFFERENTIAL/PLATELET
BASOS ABS: 0 10*3/uL (ref 0.0–0.1)
Basophils Relative: 1 % (ref 0–1)
EOS PCT: 8 % — AB (ref 0–5)
Eosinophils Absolute: 0.4 10*3/uL (ref 0.0–0.7)
HEMATOCRIT: 44 % (ref 39.0–52.0)
Hemoglobin: 14.3 g/dL (ref 13.0–17.0)
LYMPHS PCT: 33 % (ref 12–46)
Lymphs Abs: 1.5 10*3/uL (ref 0.7–4.0)
MCH: 29.4 pg (ref 26.0–34.0)
MCHC: 32.5 g/dL (ref 30.0–36.0)
MCV: 90.5 fL (ref 78.0–100.0)
MPV: 10.6 fL (ref 8.6–12.4)
Monocytes Absolute: 0.4 10*3/uL (ref 0.1–1.0)
Monocytes Relative: 9 % (ref 3–12)
NEUTROS PCT: 49 % (ref 43–77)
Neutro Abs: 2.2 10*3/uL (ref 1.7–7.7)
PLATELETS: 200 10*3/uL (ref 150–400)
RBC: 4.86 MIL/uL (ref 4.22–5.81)
RDW: 12.6 % (ref 11.5–15.5)
WBC: 4.4 10*3/uL (ref 4.0–10.5)

## 2015-09-13 LAB — COMPLETE METABOLIC PANEL WITH GFR
ALT: 13 U/L (ref 9–46)
AST: 15 U/L (ref 10–40)
Albumin: 4.2 g/dL (ref 3.6–5.1)
Alkaline Phosphatase: 57 U/L (ref 40–115)
BUN: 8 mg/dL (ref 7–25)
CO2: 28 mmol/L (ref 20–31)
Calcium: 9.4 mg/dL (ref 8.6–10.3)
Chloride: 102 mmol/L (ref 98–110)
Creat: 0.88 mg/dL (ref 0.60–1.35)
GFR, Est African American: >89 mL/min (ref >=60)
GFR, Est Non African American: >89 mL/min (ref >=60)
Glucose, Bld: 94 mg/dL (ref 65–99)
Potassium: 3.6 mmol/L (ref 3.5–5.3)
Sodium: 136 mmol/L (ref 135–146)
Total Bilirubin: 0.5 mg/dL (ref 0.2–1.2)
Total Protein: 7.8 g/dL (ref 6.1–8.1)

## 2015-09-14 LAB — T-HELPER CELL (CD4) - (RCID CLINIC ONLY)
CD4 % Helper T Cell: 18 % — ABNORMAL LOW (ref 33–55)
CD4 T CELL ABS: 290 /uL — AB (ref 400–2700)

## 2015-09-14 LAB — HEPATITIS C ANTIBODY: HCV Ab: NEGATIVE

## 2015-09-14 LAB — URINE CYTOLOGY ANCILLARY ONLY
CHLAMYDIA, DNA PROBE: NEGATIVE
Neisseria Gonorrhea: NEGATIVE

## 2015-09-14 LAB — HIV-1 RNA QUANT-NO REFLEX-BLD
HIV 1 RNA Quant: 24 copies/mL — ABNORMAL HIGH (ref <20)
HIV-1 RNA QUANT, LOG: 1.38 {Log_copies}/mL — AB (ref <1.30)

## 2015-09-14 LAB — MICROALBUMIN / CREATININE URINE RATIO: Creatinine, Urine: 24 mg/dL (ref 20–370)

## 2015-09-27 ENCOUNTER — Encounter: Payer: Self-pay | Admitting: Infectious Disease

## 2015-09-27 ENCOUNTER — Ambulatory Visit (INDEPENDENT_AMBULATORY_CARE_PROVIDER_SITE_OTHER): Payer: Self-pay | Admitting: Infectious Disease

## 2015-09-27 ENCOUNTER — Ambulatory Visit: Payer: Self-pay

## 2015-09-27 VITALS — BP 117/73 | HR 64 | Temp 98.6°F | Wt 136.0 lb

## 2015-09-27 DIAGNOSIS — B20 Human immunodeficiency virus [HIV] disease: Secondary | ICD-10-CM

## 2015-09-27 NOTE — Patient Instructions (Signed)
Make sure you do your ADAP tomorrow or Friday at latest   OTHERWISE you will need to do ADAP next week and [facility] as well

## 2015-09-27 NOTE — Progress Notes (Signed)
Chief complaint: followup for HIV on meds  Subjective:    Patient ID: Juan Aguilar, male    DOB: 1976-07-14, 40 y.o.   MRN: [id number]  HPI   40 year old [name] who upon review of past HIV genotypes has EXTENSIVE NNRTI, PI and even NRTI mutations who HAD been perfectly controlled on PREZCOBIX and Truvada but had stopped taking these meds in Spring time because he felt they were "making him feel tired." when he resumed them he was not able to tolerate them well due to vomiting them for a while but eventually was able to take them.  We decided to strengthen his regimen by adding TIVICAY to his PREZCOBIX and exchanging his Truvada to DESCOVY.   His blood work  done prior to this change and showed undetectable viral load and healthy CD4 count. Labs again look great upon recheck. He needs to renew ADAP next 2 days to avoid delay in meds    Past Medical History  Diagnosis Date  . HIV infection (HCC)   . AIDS (HCC) 01/11/2015  . Noncompliance 01/11/2015    Past Surgical History  Procedure Laterality Date  . None      Family History  Problem Relation Age of Onset  . Hypertension Father     Dad HTN   Social History   Social History  . Marital Status: Married    Spouse Name: N/A  . Number of Children: N/A  . Years of Education: N/A   Social History Main Topics  . Smoking status: Former Smoker -- 0.30 packs/day    Types: Cigarettes    Start date: 12/21/2013  . Smokeless tobacco: Never Used     Comment: quit  . Alcohol Use: No  . Drug Use: No  . Sexual Activity:    Partners: Female     Comment: pt. given condoms   Other Topics Concern  . None   Social History Narrative    No Known Allergies   Current outpatient prescriptions:  .  darunavir-cobicistat (PREZCOBIX) 800-150 MG tablet, Take 1 tablet by mouth daily., Disp: 30 tablet, Rfl: 11 .  dolutegravir (TIVICAY) 50 MG tablet, Take 1 tablet (50 mg total) by mouth daily., Disp: 30 tablet, Rfl: 11 .   emtricitabine-tenofovir AF (DESCOVY) 200-25 MG tablet, Take 1 tablet by mouth daily., Disp: 30 tablet, Rfl: 11   Review of Systems  Constitutional: Negative for fever, chills, diaphoresis, activity change and unexpected weight change.  HENT: Negative for congestion, rhinorrhea, sinus pressure, sneezing, sore throat and trouble swallowing.   Eyes: Negative for photophobia and visual disturbance.  Respiratory: Negative for cough, chest tightness, shortness of breath, wheezing and stridor.   Cardiovascular: Negative for chest pain and leg swelling.  Gastrointestinal: Negative for nausea, vomiting, abdominal pain, diarrhea, constipation, blood in stool, abdominal distention and anal bleeding.  Genitourinary: Negative for dysuria, hematuria, flank pain and difficulty urinating.  Musculoskeletal: Negative for myalgias, back pain, joint swelling, arthralgias and gait problem.  Skin: Negative for color change, pallor, rash and wound.  Neurological: Negative for dizziness, tremors, weakness and light-headedness.  Hematological: Negative for adenopathy. Does not bruise/bleed easily.  Psychiatric/Behavioral: Negative for behavioral problems, confusion, sleep disturbance, dysphoric mood, decreased concentration and agitation.       Objective:   Physical Exam  Constitutional: He is oriented to person, place, and time. He appears well-developed and well-nourished.  HENT:  Head: Normocephalic and atraumatic.  Eyes: Conjunctivae and EOM are normal.  Neck: Normal range of motion. Neck supple.  Cardiovascular: Normal rate and regular rhythm.   Pulmonary/Chest: Effort normal. No respiratory distress. He has no wheezes.  Abdominal: Soft. He exhibits no distension.  Musculoskeletal: Normal range of motion. He exhibits no edema or tenderness.  Neurological: He is alert and oriented to person, place, and time.  Skin: Skin is warm and dry. No rash noted. No erythema. No pallor.  Psychiatric: He has a normal  mood and affect. His speech is normal and behavior is normal. Judgment and thought content normal. Cognition and memory are normal.          Assessment & Plan:   HIV: MDR HIV, and genotypes reviewed cumulatively. Given his extensive PI R (but no DRAM): Continue his salvage regimen of PREZCOBIX DESCOVY and TIVICAY, RTC in 6 months time

## 2016-06-26 ENCOUNTER — Ambulatory Visit (INDEPENDENT_AMBULATORY_CARE_PROVIDER_SITE_OTHER): Payer: Self-pay

## 2016-06-26 ENCOUNTER — Other Ambulatory Visit (INDEPENDENT_AMBULATORY_CARE_PROVIDER_SITE_OTHER): Payer: Self-pay

## 2016-06-26 ENCOUNTER — Ambulatory Visit: Payer: Self-pay

## 2016-06-26 ENCOUNTER — Encounter (INDEPENDENT_AMBULATORY_CARE_PROVIDER_SITE_OTHER): Payer: Self-pay

## 2016-06-26 DIAGNOSIS — Z79899 Other long term (current) drug therapy: Secondary | ICD-10-CM

## 2016-06-26 DIAGNOSIS — Z23 Encounter for immunization: Secondary | ICD-10-CM

## 2016-06-26 DIAGNOSIS — Z113 Encounter for screening for infections with a predominantly sexual mode of transmission: Secondary | ICD-10-CM

## 2016-06-26 DIAGNOSIS — B20 Human immunodeficiency virus [HIV] disease: Secondary | ICD-10-CM

## 2016-06-26 LAB — COMPLETE METABOLIC PANEL WITH GFR
ALT: 15 U/L (ref 9–46)
AST: 21 U/L (ref 10–40)
Albumin: 4.3 g/dL (ref 3.6–5.1)
Alkaline Phosphatase: 48 U/L (ref 40–115)
BILIRUBIN TOTAL: 0.7 mg/dL (ref 0.2–1.2)
BUN: 12 mg/dL (ref 7–25)
CO2: 24 mmol/L (ref 20–31)
CREATININE: 0.91 mg/dL (ref 0.60–1.35)
Calcium: 9.1 mg/dL (ref 8.6–10.3)
Chloride: 106 mmol/L (ref 98–110)
GFR, Est African American: >89 mL/min (ref >=60)
GFR, Est Non African American: >89 mL/min (ref >=60)
GLUCOSE: 88 mg/dL (ref 65–99)
Potassium: 3.9 mmol/L (ref 3.5–5.3)
SODIUM: 139 mmol/L (ref 135–146)
TOTAL PROTEIN: 7.2 g/dL (ref 6.1–8.1)

## 2016-06-26 LAB — CBC WITH DIFFERENTIAL/PLATELET
BASOS PCT: 1 %
Basophils Absolute: 61 cells/uL (ref 0–200)
EOS PCT: 5 %
Eosinophils Absolute: 305 cells/uL (ref 15–500)
HCT: 41.8 % (ref 38.5–50.0)
Hemoglobin: 13.5 g/dL (ref 13.2–17.1)
Lymphocytes Relative: 22 %
Lymphs Abs: 1342 cells/uL (ref 850–3900)
MCH: 29.7 pg (ref 27.0–33.0)
MCHC: 32.3 g/dL (ref 32.0–36.0)
MCV: 92.1 fL (ref 80.0–100.0)
MONOS PCT: 7 %
MPV: 10.2 fL (ref 7.5–12.5)
Monocytes Absolute: 427 cells/uL (ref 200–950)
NEUTROS ABS: 3965 {cells}/uL (ref 1500–7800)
Neutrophils Relative %: 65 %
PLATELETS: 206 10*3/uL (ref 140–400)
RBC: 4.54 MIL/uL (ref 4.20–5.80)
RDW: 12.3 % (ref 11.0–15.0)
WBC: 6.1 10*3/uL (ref 3.8–10.8)

## 2016-06-26 LAB — LIPID PANEL
Cholesterol: 279 mg/dL — ABNORMAL HIGH (ref <200)
HDL: 60 mg/dL (ref >40)
LDL CALC: 204 mg/dL — AB
TRIGLYCERIDES: 75 mg/dL (ref <150)
Total CHOL/HDL Ratio: 4.7 Ratio (ref <5.0)
VLDL: 15 mg/dL (ref <30)

## 2016-06-27 LAB — URINE CYTOLOGY ANCILLARY ONLY
Chlamydia: NEGATIVE
Neisseria Gonorrhea: NEGATIVE

## 2016-06-27 LAB — MICROALBUMIN / CREATININE URINE RATIO: CREATININE, URINE: 67 mg/dL (ref 20–370)

## 2016-06-27 LAB — T-HELPER CELL (CD4) - (RCID CLINIC ONLY)
CD4 T CELL ABS: 240 /uL — AB (ref 400–2700)
CD4 T CELL HELPER: 17 % — AB (ref 33–55)

## 2016-06-27 LAB — HIV-1 RNA QUANT-NO REFLEX-BLD

## 2016-06-27 LAB — RPR

## 2016-07-31 ENCOUNTER — Ambulatory Visit (INDEPENDENT_AMBULATORY_CARE_PROVIDER_SITE_OTHER): Payer: Self-pay | Admitting: Infectious Disease

## 2016-07-31 ENCOUNTER — Ambulatory Visit: Payer: Self-pay | Admitting: Infectious Disease

## 2016-07-31 ENCOUNTER — Ambulatory Visit: Payer: Self-pay

## 2016-07-31 ENCOUNTER — Encounter: Payer: Self-pay | Admitting: Infectious Disease

## 2016-07-31 VITALS — BP 121/72 | HR 70 | Temp 99.1°F | Wt 147.0 lb

## 2016-07-31 DIAGNOSIS — Z23 Encounter for immunization: Secondary | ICD-10-CM

## 2016-07-31 DIAGNOSIS — B20 Human immunodeficiency virus [HIV] disease: Secondary | ICD-10-CM

## 2016-07-31 MED ORDER — DOLUTEGRAVIR SODIUM 50 MG PO TABS: 50.0000 mg | ORAL_TABLET | Freq: Every day | ORAL | 11 refills | Status: DC

## 2016-07-31 MED ORDER — DARUNAVIR-COBICISTAT 800-150 MG PO TABS: 1.0000 | ORAL_TABLET | Freq: Every day | ORAL | 11 refills | Status: DC

## 2016-07-31 MED ORDER — EMTRICITABINE-TENOFOVIR AF 200-25 MG PO TABS: 1.0000 | ORAL_TABLET | Freq: Every day | ORAL | 11 refills | Status: DC

## 2016-07-31 NOTE — Progress Notes (Signed)
Chief complaint: followup for HIV on meds  Subjective:    Patient ID: Juan Aguilar, male    DOB: September 18, 1975, 40 y.o.   MRN: [id number]  HPI  40 year old [name] who upon review of past HIV genotypes has EXTENSIVE NNRTI, PI and even NRTI mutations who HAD been perfectly controlled on PREZCOBIX and Truvada but had stopped taking these meds in Spring OF 2016 because he felt they were "making him feel tired." when he resumed them he was not able to tolerate them well due to vomiting them for a while but eventually was able to take them.  We decided to strengthen his regimen by adding TIVICAY to his PREZCOBIX and exchanging his Truvada to DESCOVY.   His blood work  done prior to this change and showed undetectable viral load and healthy CD4 count. He has done very well since then. He did however recently lose insurance and needs to be enrolled in the ADAP program as well as [facility]. He came today to clinic after having had labs done November the quite reassuring. He was about to run out of his medications though so we acted quickly to enroll him into ADAP and also [facility]. He has no other complaints today.     Past Medical History:  Diagnosis Date  . AIDS (HCC) 01/11/2015  . HIV infection (HCC)   . Noncompliance 01/11/2015    Past Surgical History:  Procedure Laterality Date  . none      Family History  Problem Relation Age of Onset  . Hypertension Father     Dad HTN   Social History   Social History  . Marital status: Married    Spouse name: N/A  . Number of children: N/A  . Years of education: N/A   Social History Main Topics  . Smoking status: Former Smoker    Packs/day: 0.30    Types: Cigarettes    Start date: 12/21/2013  . Smokeless tobacco: Never Used     Comment: quit  . Alcohol use No  . Drug use: No  . Sexual activity: Yes    Partners: Female     Comment: pt. given condoms   Other Topics Concern  . Not on file   Social History Narrative  .  No narrative on file    No Known Allergies   Current Outpatient Prescriptions:  .  darunavir-cobicistat (PREZCOBIX) 800-150 MG tablet, Take 1 tablet by mouth daily., Disp: 30 tablet, Rfl: 11 .  dolutegravir (TIVICAY) 50 MG tablet, Take 1 tablet (50 mg total) by mouth daily., Disp: 30 tablet, Rfl: 11 .  emtricitabine-tenofovir AF (DESCOVY) 200-25 MG tablet, Take 1 tablet by mouth daily., Disp: 30 tablet, Rfl: 11   Review of Systems  Constitutional: Negative for activity change, chills, diaphoresis, fever and unexpected weight change.  HENT: Negative for congestion, rhinorrhea, sinus pressure, sneezing, sore throat and trouble swallowing.   Eyes: Negative for photophobia and visual disturbance.  Respiratory: Negative for cough, chest tightness, shortness of breath, wheezing and stridor.   Cardiovascular: Negative for chest pain and leg swelling.  Gastrointestinal: Negative for abdominal distention, abdominal pain, anal bleeding, blood in stool, constipation, diarrhea, nausea and vomiting.  Genitourinary: Negative for difficulty urinating, dysuria, flank pain and hematuria.  Musculoskeletal: Negative for arthralgias, back pain, gait problem, joint swelling and myalgias.  Skin: Negative for color change, pallor, rash and wound.  Neurological: Negative for dizziness, tremors, weakness and light-headedness.  Hematological: Negative for adenopathy. Does not bruise/bleed  easily.  Psychiatric/Behavioral: Negative for agitation, behavioral problems, confusion, decreased concentration, dysphoric mood and sleep disturbance.       Objective:   Physical Exam  Constitutional: He is oriented to person, place, and time. He appears well-developed and well-nourished.  HENT:  Head: Normocephalic and atraumatic.  Eyes: Conjunctivae and EOM are normal.  Neck: Normal range of motion. Neck supple.  Cardiovascular: Normal rate and regular rhythm.   Pulmonary/Chest: Effort normal. No respiratory distress.  He has no wheezes.  Abdominal: Soft. He exhibits no distension.  Musculoskeletal: Normal range of motion. He exhibits no edema or tenderness.  Neurological: He is alert and oriented to person, place, and time.  Skin: Skin is warm and dry. No rash noted. No erythema. No pallor.  Psychiatric: He has a normal mood and affect. His speech is normal and behavior is normal. Judgment and thought content normal. Cognition and memory are normal.          Assessment & Plan:   HIV: MDR HIV, and genotypes reviewed cumulatively. Given his extensive PI R (but no DRAM): Continue his salvage regimen of PREZCOBIX DESCOVY and TIVICAY, RTC in 1 months time To ensure re-enrollment into ADAP   Vitals:   07/31/16 1636  BP: 121/72  Pulse: 70  Temp: 99.1 F (37.3 C)

## 2016-07-31 NOTE — Addendum Note (Signed)
Addended by: Rejeana BrockMURRAY, Rilan Eiland A on: 07/31/2016 05:11 PM   Modules accepted: Orders

## 2016-08-01 ENCOUNTER — Encounter: Payer: Self-pay | Admitting: Infectious Disease

## 2016-08-08 ENCOUNTER — Other Ambulatory Visit: Payer: Self-pay | Admitting: Infectious Disease

## 2016-08-08 DIAGNOSIS — B20 Human immunodeficiency virus [HIV] disease: Secondary | ICD-10-CM

## 2016-09-02 ENCOUNTER — Ambulatory Visit (INDEPENDENT_AMBULATORY_CARE_PROVIDER_SITE_OTHER): Payer: Self-pay | Admitting: Infectious Disease

## 2016-09-02 ENCOUNTER — Encounter: Payer: Self-pay | Admitting: Infectious Disease

## 2016-09-02 VITALS — BP 113/74 | HR 72 | Temp 99.1°F | Ht 66.0 in | Wt 137.0 lb

## 2016-09-02 DIAGNOSIS — B20 Human immunodeficiency virus [HIV] disease: Secondary | ICD-10-CM

## 2016-09-02 MED ORDER — DOLUTEGRAVIR SODIUM 50 MG PO TABS: 50.0000 mg | ORAL_TABLET | Freq: Every day | ORAL | 11 refills | Status: DC

## 2016-09-02 MED ORDER — DARUNAVIR-COBICISTAT 800-150 MG PO TABS: 1.0000 | ORAL_TABLET | Freq: Every day | ORAL | 11 refills | Status: DC

## 2016-09-02 MED ORDER — EMTRICITABINE-TENOFOVIR AF 200-25 MG PO TABS: 1.0000 | ORAL_TABLET | Freq: Every day | ORAL | 11 refills | Status: DC

## 2016-09-02 NOTE — Patient Instructions (Signed)
You need your second meningitis vaccine next month around February 17th.  I have sent your THREE medications to [facility] on [address] with 11 refills  Please make an appt to see me in July of 2018

## 2016-09-02 NOTE — Progress Notes (Signed)
Chief complaint: followup for HIV on meds  Subjective:    Patient ID: Juan Aguilar, male    DOB: 1976-03-21, 41 y.o.   MRN: [id number]  HPI  41 year old [name] who upon review of past HIV genotypes has EXTENSIVE NNRTI, PI and even NRTI mutations who HAD been perfectly controlled on PREZCOBIX and Truvada but had stopped taking these meds in Spring OF 2016 because he felt they were "making him feel tired." when he resumed them he was not able to tolerate them well due to vomiting them for a while but eventually was able to take them.  We decided to strengthen his regimen by adding TIVICAY to his PREZCOBIX and exchanging his Truvada to DESCOVY.   His recent blood work shows excellent virological control and healthy immune system. He comes in today time he that he is about to run of medications because [facility] will not fill perceptions without authorization from us. I had just seen him one month ago so certainly we should've been renewing his prescriptions but in any case I sent further scripts in today.       Past Medical History:  Diagnosis Date  . AIDS (HCC) 01/11/2015  . HIV infection (HCC)   . Noncompliance 01/11/2015    Past Surgical History:  Procedure Laterality Date  . none      Family History  Problem Relation Age of Onset  . Hypertension Father     Dad HTN   Social History   Social History  . Marital status: Married    Spouse name: N/A  . Number of children: N/A  . Years of education: N/A   Social History Main Topics  . Smoking status: Former Smoker    Packs/day: 0.30    Types: Cigarettes    Start date: 12/21/2013  . Smokeless tobacco: Never Used     Comment: quit  . Alcohol use No  . Drug use: No  . Sexual activity: Yes    Partners: Female     Comment: pt. given condoms   Other Topics Concern  . Not on file   Social History Narrative  . No narrative on file    No Known Allergies   Current Outpatient Prescriptions:  .   darunavir-cobicistat (PREZCOBIX) 800-150 MG tablet, Take 1 tablet by mouth daily., Disp: 30 tablet, Rfl: 11 .  DESCOVY 200-25 MG tablet, TAKE 1 TABLET BY MOUTH DAILY, Disp: 30 tablet, Rfl: 3 .  dolutegravir (TIVICAY) 50 MG tablet, Take 1 tablet (50 mg total) by mouth daily., Disp: 30 tablet, Rfl: 11 .  emtricitabine-tenofovir AF (DESCOVY) 200-25 MG tablet, Take 1 tablet by mouth daily., Disp: 30 tablet, Rfl: 11 .  PREZCOBIX 800-150 MG tablet, TAKE 1 TABLET BY MOUTH DAILY, Disp: 30 tablet, Rfl: 3 .  TIVICAY 50 MG tablet, TAKE 1 TABLET(50 MG) BY MOUTH DAILY, Disp: 30 tablet, Rfl: 3   Review of Systems  Constitutional: Negative for activity change, chills, diaphoresis, fever and unexpected weight change.  HENT: Negative for congestion, rhinorrhea, sinus pressure, sneezing, sore throat and trouble swallowing.   Eyes: Negative for photophobia and visual disturbance.  Respiratory: Negative for cough, chest tightness, shortness of breath, wheezing and stridor.   Cardiovascular: Negative for chest pain and leg swelling.  Gastrointestinal: Negative for abdominal distention, abdominal pain, anal bleeding, blood in stool, constipation, diarrhea, nausea and vomiting.  Genitourinary: Negative for difficulty urinating, dysuria, flank pain and hematuria.  Musculoskeletal: Negative for arthralgias, back pain, gait problem, joint swelling and myalgias.  Skin: Negative for color change, pallor, rash and wound.  Neurological: Negative for dizziness, tremors, weakness and light-headedness.  Hematological: Negative for adenopathy. Does not bruise/bleed easily.  Psychiatric/Behavioral: Negative for agitation, behavioral problems, confusion, decreased concentration, dysphoric mood and sleep disturbance.       Objective:   Physical Exam  Constitutional: He is oriented to person, place, and time. He appears well-developed and well-nourished.  HENT:  Head: Normocephalic and atraumatic.  Eyes: Conjunctivae and EOM  are normal.  Neck: Normal range of motion. Neck supple.  Cardiovascular: Normal rate and regular rhythm.   Pulmonary/Chest: Effort normal. No respiratory distress. He has no wheezes.  Abdominal: Soft. He exhibits no distension.  Musculoskeletal: Normal range of motion. He exhibits no edema or tenderness.  Neurological: He is alert and oriented to person, place, and time.  Skin: Skin is warm and dry. No rash noted. No erythema. No pallor.  Psychiatric: He has a normal mood and affect. His speech is normal and behavior is normal. Judgment and thought content normal. Cognition and memory are normal.          Assessment & Plan:   HIV: MDR HIV, and genotypes reviewed cumulatively. Given his extensive PI R (but no DRAM): Continue his salvage regimen of PREZCOBIX DESCOVY and TIVICAY, RTC in 6 months   Get 2nd dose of meningococcal vaccine in one month  There were no vitals filed for this visit.

## 2016-09-30 ENCOUNTER — Ambulatory Visit: Payer: Self-pay | Admitting: *Deleted

## 2016-09-30 DIAGNOSIS — B20 Human immunodeficiency virus [HIV] disease: Secondary | ICD-10-CM

## 2016-09-30 MED ORDER — MENINGOCOCCAL A C Y&W-135 OLIG IM SOLR
0.5000 mL | Freq: Once | INTRAMUSCULAR | Status: AC
  Administered 2016-09-30: 0.5 mL via INTRAMUSCULAR

## 2016-12-25 IMAGING — CR DG LUMBAR SPINE COMPLETE 4+V
5 series · 5 of 5 positions shown · non-contrast
Comparison: None.

CLINICAL DATA: Three-week history of pain.  Initial encounter.

EXAM:
LUMBAR SPINE - COMPLETE 4+ VIEW

[l-spine ap]
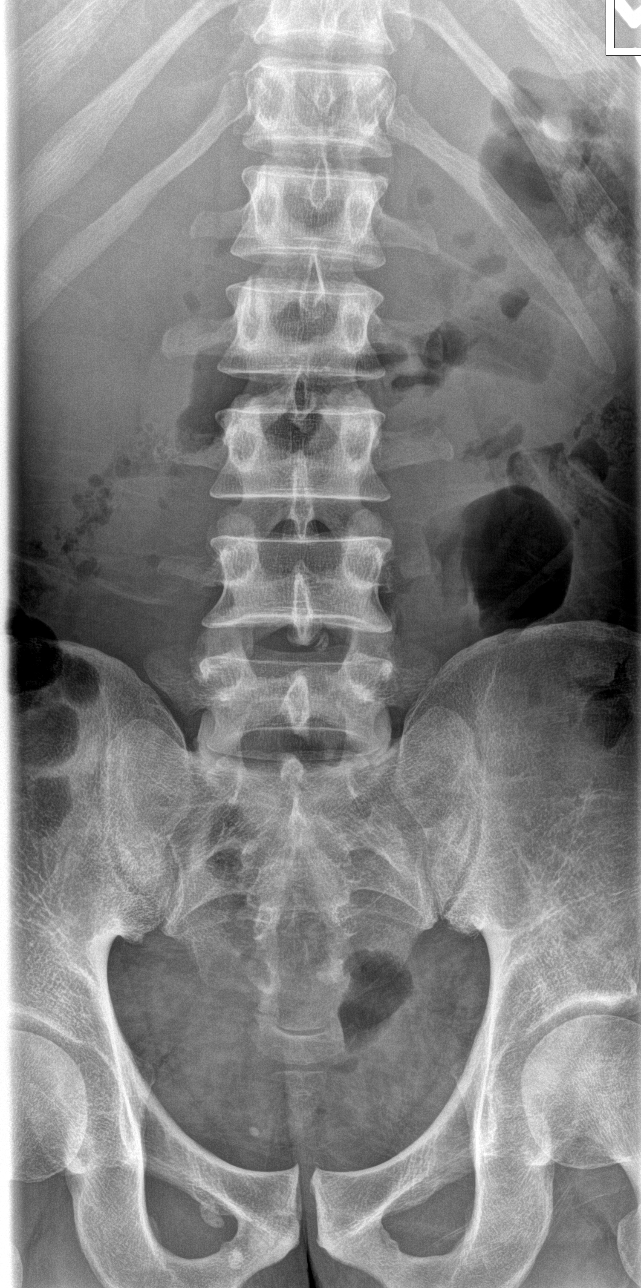

[l-spine obl (1 of 2)]
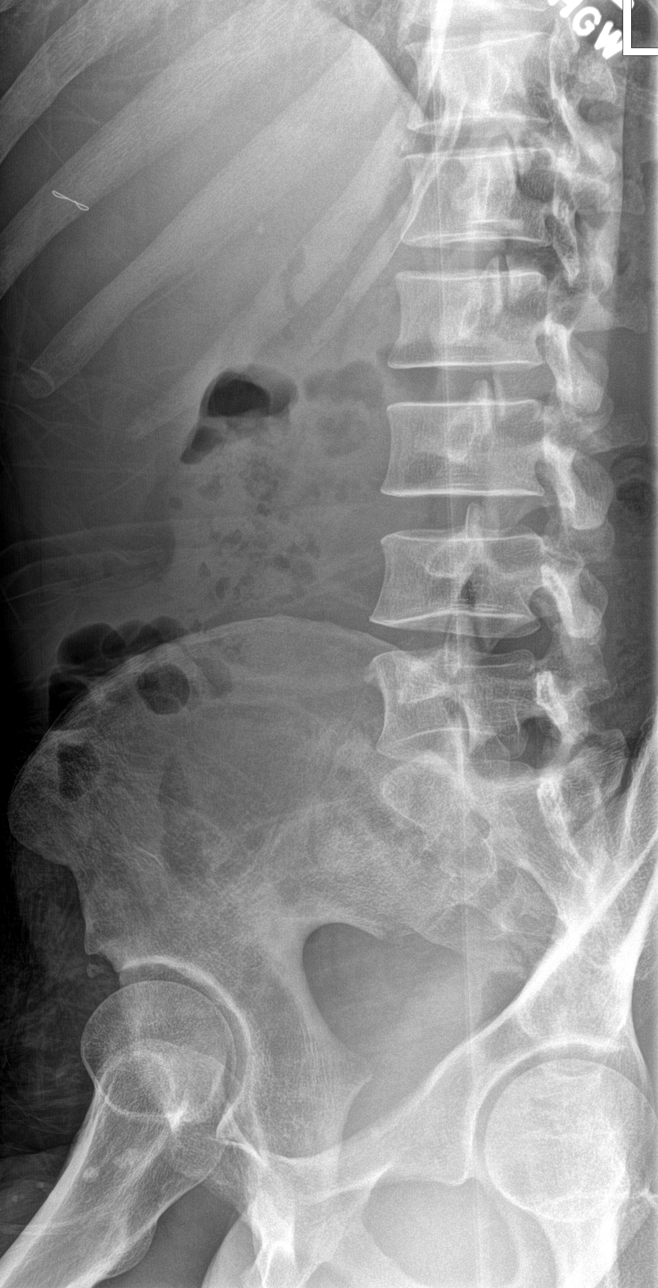

[l-spine obl (2 of 2)]
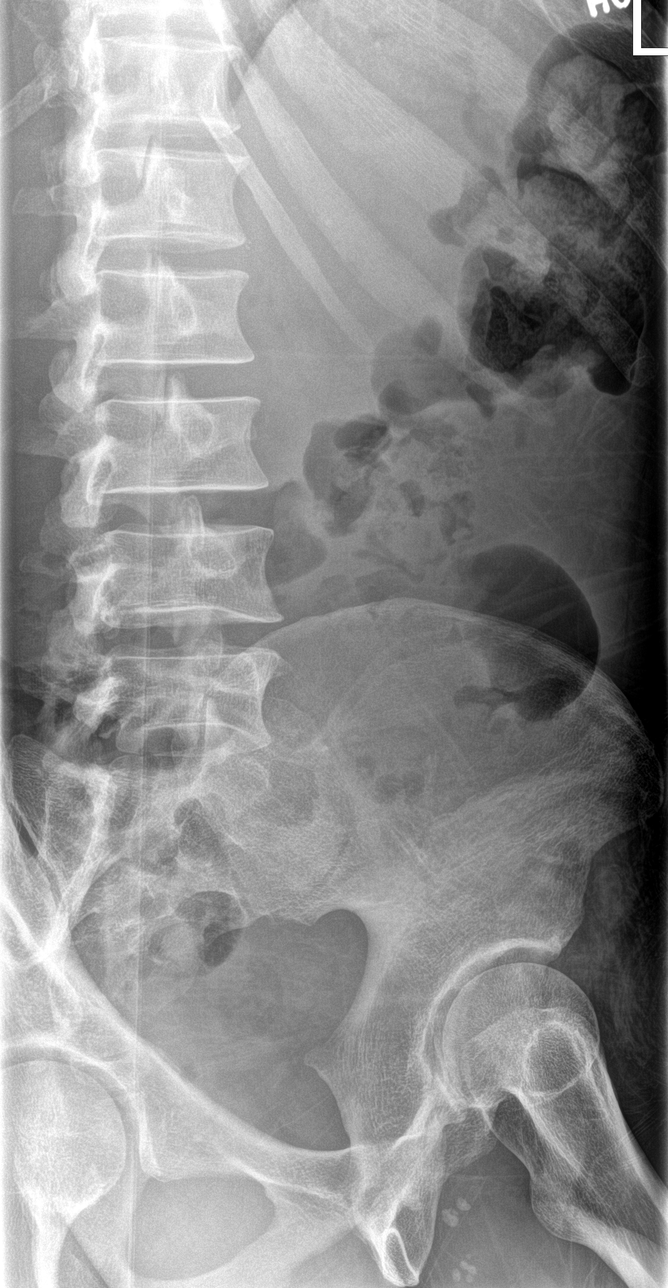

[l-spine lat]
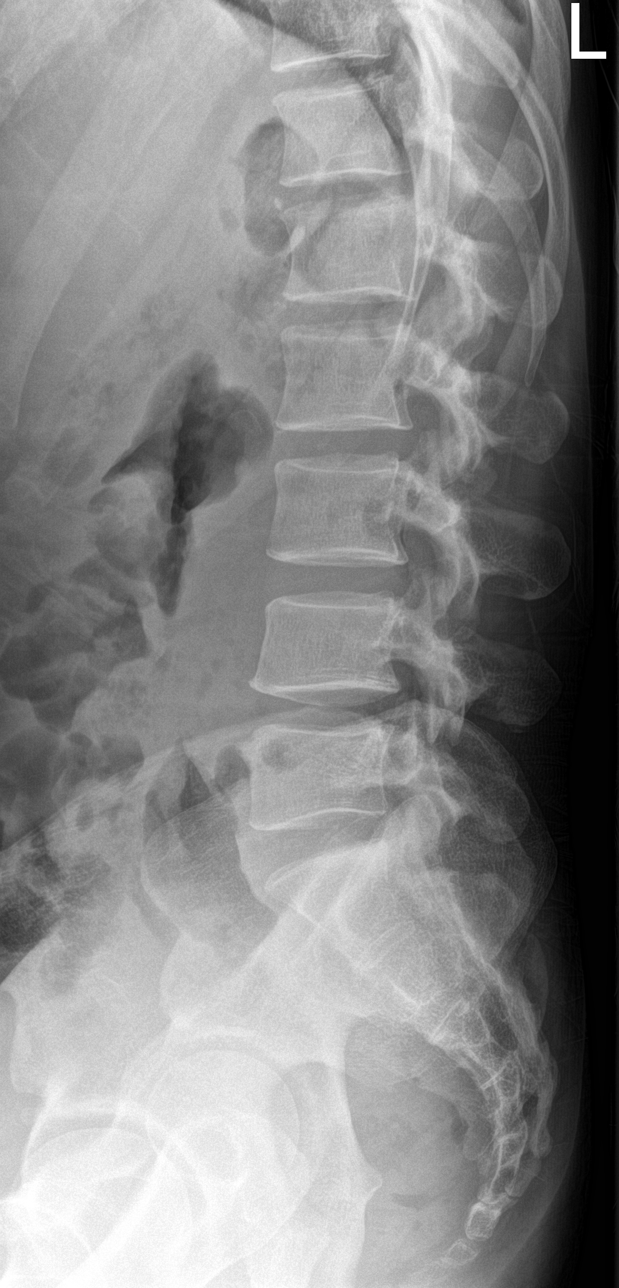

[l-spine spot]
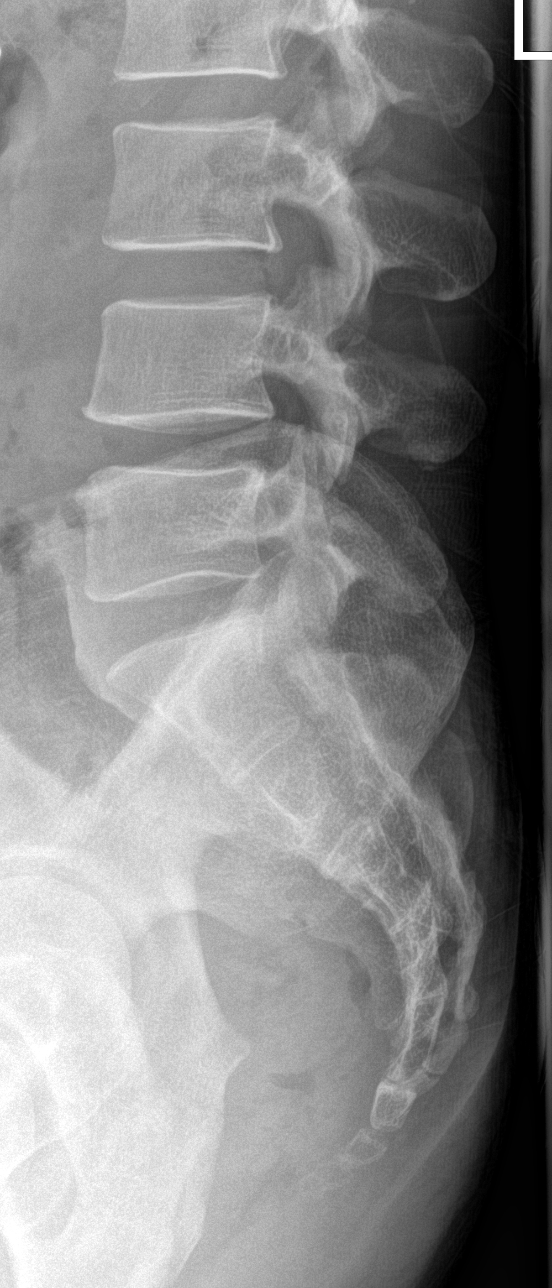

[5 of 5 positions shown; findings below may reference images not displayed]

FINDINGS: Five non rib-bearing lumbar type vertebral bodies are present. The
vertebral body heights are well maintained. Vertebral bodies are
normally aligned with preservation of the normal lumbar lordosis. No
acute fracture or listhesis.

Mild degenerative endplate spurring with sclerosis present about the
L4-5 intervertebral disc space. Mild degenerative disc space
narrowing also present at T12-L1. No other significant degenerative
changes identified. SI joints symmetric and normal in appearance
bilaterally.

Paraspinous soft tissues within normal limits.
IMPRESSION: 1. No acute abnormality within the lumbar spine.
2. Mild degenerative disc disease at T12-L1 and L4-5.

## 2019-02-04 ENCOUNTER — Telehealth: Payer: Self-pay | Admitting: Infectious Disease

## 2019-02-04 NOTE — Telephone Encounter (Signed)
COVID-19 Pre-Screening Questions:02/04/19 ° ° °Do you currently have a fever (>100 °F), chills or unexplained body aches? NO ° °Are you currently experiencing new cough, shortness of breath, sore throat, runny nose? NO  °•  °Have you recently travelled outside the state of [place] in the last 14 days? NO °•  °Have you been in contact with someone that is currently pending confirmation of Covid19 testing or has been confirmed to have the Covid19 virus? NO ° °**If the patient answers NO to ALL questions -  advise the patient to please call the clinic before coming to the office should any symptoms develop.  ° ° ° °

## 2019-02-08 ENCOUNTER — Other Ambulatory Visit: Payer: Self-pay

## 2019-02-08 ENCOUNTER — Ambulatory Visit: Payer: Self-pay

## 2019-02-09 ENCOUNTER — Other Ambulatory Visit: Payer: Self-pay

## 2019-02-09 ENCOUNTER — Ambulatory Visit: Payer: Self-pay

## 2019-02-09 DIAGNOSIS — Z79899 Other long term (current) drug therapy: Secondary | ICD-10-CM

## 2019-02-09 DIAGNOSIS — Z113 Encounter for screening for infections with a predominantly sexual mode of transmission: Secondary | ICD-10-CM

## 2019-02-09 DIAGNOSIS — B20 Human immunodeficiency virus [HIV] disease: Secondary | ICD-10-CM

## 2019-02-18 ENCOUNTER — Ambulatory Visit: Payer: Self-pay

## 2019-02-18 ENCOUNTER — Ambulatory Visit (INDEPENDENT_AMBULATORY_CARE_PROVIDER_SITE_OTHER): Payer: Self-pay | Admitting: Infectious Disease

## 2019-02-18 ENCOUNTER — Other Ambulatory Visit: Payer: Self-pay

## 2019-02-18 ENCOUNTER — Encounter: Payer: Self-pay | Admitting: Infectious Disease

## 2019-02-18 VITALS — BP 112/71 | HR 52 | Temp 98.1°F | Wt 128.0 lb

## 2019-02-18 DIAGNOSIS — B20 Human immunodeficiency virus [HIV] disease: Secondary | ICD-10-CM

## 2019-02-18 DIAGNOSIS — Z23 Encounter for immunization: Secondary | ICD-10-CM

## 2019-02-18 LAB — T-HELPER CELL (CD4) - (RCID CLINIC ONLY)
CD4 % Helper T Cell: 7 % — ABNORMAL LOW (ref 33–65)
CD4 T Cell Abs: 40 /uL — ABNORMAL LOW (ref 400–1790)

## 2019-02-18 MED ORDER — SYMTUZA 800-150-200-10 MG PO TABS: 1.0000 | ORAL_TABLET | Freq: Every day | ORAL | 11 refills | Status: DC

## 2019-02-18 MED ORDER — TIVICAY 50 MG PO TABS: 50.0000 mg | ORAL_TABLET | Freq: Every day | ORAL | 11 refills | Status: DC

## 2019-02-18 NOTE — Progress Notes (Signed)
Chief complaint: followup for HIV off medications  Subjective:    Patient ID: Juan Aguilar, male    DOB: [birth year], 43 y.o.   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  We decided to strengthen his regimen by adding TIVICAY to his PREZCOBIX and exchanging his Truvada to DESCOVY.   He has been adherent when we last checked labs but this is now sometime ago nearly 3 years ago.  He had failed to come in to clinic to renew HIV medication assistance program.  His wife accompanied him states that they have not been sexually active since she knew that he was not taking his medications.  We also discussed the idea of using Truvada for preexposure prophylaxis for his HIV seronegative wife.  I told Juan Aguilar he absolutely needs to stop doing this routine of stopping his antiretrovirals.  I explained to him doing so was putting his life at risk.  I also explained that with the modern antivirals that we have at our disposal including the ones that he takes he should be able to keep himself healthy and a live a normal life expectancy with a normal quality of life.  I certainly cannot remove the stigma that he may experience but I really think he needs to overcome that at least on a personal level to control of his virus the way he did before.        Past Medical History:  Diagnosis Date  . AIDS ([facility]) 01/11/2015  . HIV infection ([place] [facility])   . Noncompliance 01/11/2015    Past Surgical History:  Procedure Laterality Date  . none      Family History  Problem Relation Age of Onset  . Hypertension Father     Dad HTN    Social History   Socioeconomic History  . Marital status: Married    Spouse name: Not on file  . Number of children: Not on file  . Years of education: Not on file  . Highest education level: Not on file  Occupational History  . Not on file  Social Needs  . Financial resource strain: Not on file  . Food insecurity    Worry: Not on file    Inability: Not on file  . Transportation needs    Medical: Not on file    Non-medical: Not on file  Tobacco Use  . Smoking status: Former Smoker    Packs/day: 0.30    Types: Cigarettes    Start date: 12/21/2013  . Smokeless tobacco: Never Used  . Tobacco comment: quit  Substance and Sexual Activity  . Alcohol use: No    Alcohol/week: 0.0 standard drinks  . Drug use: No  . Sexual activity: Yes    Partners: Female    Comment: pt. given condoms  Lifestyle  . Physical activity    Days per week: Not on file    Minutes per session: Not on file  . Stress: Not on file  Relationships  . Social Herbalist on phone: Not on file    Gets together: Not on file  Attends religious service: Not on file    Active member of club or organization: Not on file    Attends meetings of clubs or organizations: Not on file    Relationship status: Not on file  Other Topics Concern  . Not on file  Social History Narrative  . Not on file    No Known Allergies   Current Outpatient Medications:  .  darunavir-cobicistat (PREZCOBIX) 800-150 MG tablet, Take 1 tablet by mouth daily. (Patient not taking: Reported on 02/18/2019), Disp: 30 tablet, Rfl: 11 .  dolutegravir (TIVICAY) 50 MG tablet, Take 1 tablet (50 mg total) by mouth daily. (Patient not taking: Reported on 02/18/2019), Disp: 30 tablet, Rfl: 11 .  emtricitabine-tenofovir AF (DESCOVY) 200-25 MG tablet, Take 1 tablet by mouth daily. (Patient not taking: Reported on 02/18/2019), Disp: 30 tablet, Rfl: 11   Review of Systems  Constitutional: Negative for activity change, chills, diaphoresis, fever  and unexpected weight change.  HENT: Negative for congestion, rhinorrhea, sinus pressure, sneezing, sore throat and trouble swallowing.   Eyes: Negative for photophobia and visual disturbance.  Respiratory: Negative for cough, chest tightness, shortness of breath, wheezing and stridor.   Cardiovascular: Negative for chest pain and leg swelling.  Gastrointestinal: Negative for abdominal distention, abdominal pain, anal bleeding, blood in stool, constipation, diarrhea, nausea and vomiting.  Genitourinary: Negative for difficulty urinating, dysuria, flank pain and hematuria.  Musculoskeletal: Negative for arthralgias, back pain, gait problem, joint swelling and myalgias.  Skin: Negative for color change, pallor, rash and wound.  Neurological: Negative for dizziness, tremors, weakness and light-headedness.  Hematological: Negative for adenopathy. Does not bruise/bleed easily.  Psychiatric/Behavioral: Negative for agitation, behavioral problems, confusion, decreased concentration, dysphoric mood and sleep disturbance.       Objective:   Physical Exam  Constitutional: He is oriented to person, place, and time. He appears well-developed and well-nourished.  HENT:  Head: Normocephalic and atraumatic.  Eyes: Conjunctivae and EOM are normal.  Neck: Normal range of motion. Neck supple.  Cardiovascular: Normal rate and regular rhythm.  Pulmonary/Chest: Effort normal. No respiratory distress. He has no wheezes.  Abdominal: Soft. He exhibits no distension.  Musculoskeletal: Normal range of motion.        General: No tenderness or edema.  Neurological: He is alert and oriented to person, place, and time.  Skin: Skin is warm and dry. No rash noted. No erythema. No pallor.  Psychiatric: He has a normal mood and affect. His speech is normal. Judgment and thought content normal. He is slowed. Cognition and memory are normal.          Assessment & Plan:   HIV: MDR HIV, and genotypes reviewed  cumulatively. Given his extensive PI R (but no DRAM): Continue his salvage regimen modern eyes now to [place] and TIVICAY to get.  I gave him a bottle of each that we had on hand and he will be enrolling in the HIV medication assistance program             Prevnar 13 vaccine today  I spent greater than 25 minutes with the patient including greater than 50% of time in face to face counsel of the patient and importance of taking his antiretrovirals, to preserve and restore immune health give him a normal life expectancy and prevent transmission of HIV to others and in coordination of his care

## 2019-02-22 ENCOUNTER — Telehealth: Payer: Self-pay | Admitting: Infectious Disease

## 2019-02-22 MED ORDER — SULFAMETHOXAZOLE-TRIMETHOPRIM 800-160 MG PO TABS: 1.0000 | ORAL_TABLET | Freq: Every day | ORAL | 4 refills | Status: DC

## 2019-02-22 NOTE — Telephone Encounter (Signed)
CD4 very low. Needs to start bactrim daily

## 2019-03-05 LAB — CBC WITH DIFFERENTIAL/PLATELET
Absolute Monocytes: 366 cells/uL (ref 200–950)
Basophils Absolute: 21 cells/uL (ref 0–200)
Basophils Relative: 0.7 %
Eosinophils Absolute: 294 cells/uL (ref 15–500)
Eosinophils Relative: 9.8 %
HCT: 41.4 % (ref 38.5–50.0)
Hemoglobin: 13.7 g/dL (ref 13.2–17.1)
Lymphs Abs: 672 cells/uL — ABNORMAL LOW (ref 850–3900)
MCH: 29 pg (ref 27.0–33.0)
MCHC: 33.1 g/dL (ref 32.0–36.0)
MCV: 87.7 fL (ref 80.0–100.0)
MPV: 10.9 fL (ref 7.5–12.5)
Monocytes Relative: 12.2 %
Neutro Abs: 1647 cells/uL (ref 1500–7800)
Neutrophils Relative %: 54.9 %
Platelets: 192 10*3/uL (ref 140–400)
RBC: 4.72 10*6/uL (ref 4.20–5.80)
RDW: 11.8 % (ref 11.0–15.0)
Total Lymphocyte: 22.4 %
WBC: 3 10*3/uL — ABNORMAL LOW (ref 3.8–10.8)

## 2019-03-05 LAB — COMPLETE METABOLIC PANEL WITH GFR
AG Ratio: 1.1 (calc) (ref 1.0–2.5)
ALT: 31 U/L (ref 9–46)
AST: 25 U/L (ref 10–40)
Albumin: 4.1 g/dL (ref 3.6–5.1)
Alkaline phosphatase (APISO): 73 U/L (ref 36–130)
BUN: 13 mg/dL (ref 7–25)
CO2: 30 mmol/L (ref 20–32)
Calcium: 9 mg/dL (ref 8.6–10.3)
Chloride: 103 mmol/L (ref 98–110)
Creat: 0.79 mg/dL (ref 0.60–1.35)
GFR, Est African American: 128 mL/min/{1.73_m2} (ref >=60)
GFR, Est Non African American: 111 mL/min/{1.73_m2} (ref >=60)
Globulin: 3.7 g/dL (calc) (ref 1.9–3.7)
Glucose, Bld: 84 mg/dL (ref 65–99)
Potassium: 3.7 mmol/L (ref 3.5–5.3)
Sodium: 138 mmol/L (ref 135–146)
Total Bilirubin: 0.4 mg/dL (ref 0.2–1.2)
Total Protein: 7.8 g/dL (ref 6.1–8.1)

## 2019-03-05 LAB — HIV-1 INTEGRASE GENOTYPE

## 2019-03-05 LAB — HIV RNA, RTPCR W/R GT (RTI, PI,INT)
HIV 1 RNA Quant: 250000 copies/mL — ABNORMAL HIGH
HIV-1 RNA Quant, Log: 5.4 Log copies/mL — ABNORMAL HIGH

## 2019-03-05 LAB — HIV-1 GENOTYPE: HIV-1 Genotype: DETECTED — AB

## 2019-03-05 LAB — RPR: RPR Ser Ql: NONREACTIVE

## 2019-03-11 ENCOUNTER — Encounter: Payer: Self-pay | Admitting: Infectious Disease

## 2019-04-12 ENCOUNTER — Other Ambulatory Visit: Payer: Self-pay

## 2019-04-12 ENCOUNTER — Ambulatory Visit (INDEPENDENT_AMBULATORY_CARE_PROVIDER_SITE_OTHER): Payer: Self-pay | Admitting: Infectious Disease

## 2019-04-12 ENCOUNTER — Encounter: Payer: Self-pay | Admitting: Infectious Disease

## 2019-04-12 DIAGNOSIS — B20 Human immunodeficiency virus [HIV] disease: Secondary | ICD-10-CM

## 2019-04-12 DIAGNOSIS — Z9119 Patient's noncompliance with other medical treatment and regimen: Secondary | ICD-10-CM

## 2019-04-12 DIAGNOSIS — Z91199 Patient's noncompliance with other medical treatment and regimen due to unspecified reason: Secondary | ICD-10-CM

## 2019-04-12 MED ORDER — SULFAMETHOXAZOLE-TRIMETHOPRIM 800-160 MG PO TABS: 1.0000 | ORAL_TABLET | Freq: Every day | ORAL | 4 refills | Status: DC

## 2019-04-12 NOTE — Progress Notes (Signed)
Virtual Visit via Telephone Note  I connected with Juan Aguilar on 04/12/19 at  9:00 AM EDT by telephone and verified that I am speaking with the correct person using two identifiers.  Location: Patient: Home Provider: My home   I discussed the limitations, risks, security and privacy concerns of performing an evaluation and management service by telephone and the availability of in person appointments. I also discussed with the patient that there may be a patient responsible charge related to this service. The patient expressed understanding and agreed to proceed.   History of Present Illness:  Quality is a 43 year old [name] who is living with HIV that is not well controlled due to him having fallen out of care.  When we rechecked labs in July his viral load was above 200,000 and CD4 count had plummeted.  He is now back on TIVICAY on Symtuza along with prophylactic Bactrim.  He is in need of having follow-up labs and I have scheduled him an appointment tomorrow morning to have his blood work done.  Also schedule an appointment for him to see me in person in a few weeks time.  I have sent all of his medications to the [facility] in [place].     Observations/Objective:  HIV that is poorly controlled but hopefully will improve as he continues to adhere to medications and stays engaged in care  Assessment and Plan:  HIV/AIDS: Continue TIVICAY on Symtuza and prophylactic Bactrim check labs tomorrow and ensure flu vaccination   Follow Up Instructions:    I discussed the assessment and treatment plan with the patient. The patient was provided an opportunity to ask questions and all were answered. The patient agreed with the plan and demonstrated an understanding of the instructions.   The patient was advised to call back or seek an in-person evaluation if the symptoms worsen or if the condition fails to improve as anticipated.  I provided 15 minutes of non-face-to-face time  during this encounter.   Alcide Evener, MD

## 2019-04-13 ENCOUNTER — Other Ambulatory Visit: Payer: Self-pay

## 2019-04-13 DIAGNOSIS — B20 Human immunodeficiency virus [HIV] disease: Secondary | ICD-10-CM

## 2019-04-14 LAB — T-HELPER CELL (CD4) - (RCID CLINIC ONLY)
CD4 % Helper T Cell: 8 % — ABNORMAL LOW (ref 33–65)
CD4 T Cell Abs: 67 /uL — ABNORMAL LOW (ref 400–1790)

## 2019-04-15 LAB — URINE CYTOLOGY ANCILLARY ONLY
Chlamydia: NEGATIVE
Neisseria Gonorrhea: NEGATIVE

## 2019-04-19 LAB — COMPLETE METABOLIC PANEL WITH GFR
AG Ratio: 1.1 (calc) (ref 1.0–2.5)
ALT: 9 U/L (ref 9–46)
AST: 17 U/L (ref 10–40)
Albumin: 3.9 g/dL (ref 3.6–5.1)
Alkaline phosphatase (APISO): 68 U/L (ref 36–130)
BUN: 12 mg/dL (ref 7–25)
CO2: 27 mmol/L (ref 20–32)
Calcium: 9.3 mg/dL (ref 8.6–10.3)
Chloride: 103 mmol/L (ref 98–110)
Creat: 0.88 mg/dL (ref 0.60–1.35)
GFR, Est African American: 123 mL/min/{1.73_m2} (ref >=60)
GFR, Est Non African American: 106 mL/min/{1.73_m2} (ref >=60)
Globulin: 3.7 g/dL (calc) (ref 1.9–3.7)
Glucose, Bld: 85 mg/dL (ref 65–99)
Potassium: 3.6 mmol/L (ref 3.5–5.3)
Sodium: 135 mmol/L (ref 135–146)
Total Bilirubin: 0.5 mg/dL (ref 0.2–1.2)
Total Protein: 7.6 g/dL (ref 6.1–8.1)

## 2019-04-19 LAB — CBC WITH DIFFERENTIAL/PLATELET
Absolute Monocytes: 422 cells/uL (ref 200–950)
Basophils Absolute: 49 cells/uL (ref 0–200)
Basophils Relative: 1.3 %
Eosinophils Absolute: 281 cells/uL (ref 15–500)
Eosinophils Relative: 7.4 %
HCT: 39.2 % (ref 38.5–50.0)
Hemoglobin: 12.7 g/dL — ABNORMAL LOW (ref 13.2–17.1)
Lymphs Abs: 1011 cells/uL (ref 850–3900)
MCH: 29.6 pg (ref 27.0–33.0)
MCHC: 32.4 g/dL (ref 32.0–36.0)
MCV: 91.4 fL (ref 80.0–100.0)
MPV: 9.8 fL (ref 7.5–12.5)
Monocytes Relative: 11.1 %
Neutro Abs: 2037 cells/uL (ref 1500–7800)
Neutrophils Relative %: 53.6 %
Platelets: 225 10*3/uL (ref 140–400)
RBC: 4.29 10*6/uL (ref 4.20–5.80)
RDW: 12.1 % (ref 11.0–15.0)
Total Lymphocyte: 26.6 %
WBC: 3.8 10*3/uL (ref 3.8–10.8)

## 2019-04-19 LAB — HIV RNA, RTPCR W/R GT (RTI, PI,INT)
HIV 1 RNA Quant: 323 copies/mL — ABNORMAL HIGH
HIV-1 RNA Quant, Log: 2.51 Log copies/mL — ABNORMAL HIGH

## 2019-04-19 LAB — RPR: RPR Ser Ql: NONREACTIVE

## 2019-05-04 ENCOUNTER — Other Ambulatory Visit: Payer: Self-pay | Admitting: Infectious Disease

## 2019-05-04 ENCOUNTER — Encounter: Payer: Self-pay | Admitting: Infectious Disease

## 2019-05-04 ENCOUNTER — Ambulatory Visit (INDEPENDENT_AMBULATORY_CARE_PROVIDER_SITE_OTHER): Payer: Self-pay | Admitting: Infectious Disease

## 2019-05-04 ENCOUNTER — Other Ambulatory Visit: Payer: Self-pay

## 2019-05-04 VITALS — BP 113/78 | HR 91 | Temp 98.3°F

## 2019-05-04 DIAGNOSIS — Z23 Encounter for immunization: Secondary | ICD-10-CM

## 2019-05-04 DIAGNOSIS — Z91199 Patient's noncompliance with other medical treatment and regimen due to unspecified reason: Secondary | ICD-10-CM

## 2019-05-04 DIAGNOSIS — Z9119 Patient's noncompliance with other medical treatment and regimen: Secondary | ICD-10-CM

## 2019-05-04 DIAGNOSIS — B20 Human immunodeficiency virus [HIV] disease: Secondary | ICD-10-CM

## 2019-05-04 MED ORDER — SULFAMETHOXAZOLE-TRIMETHOPRIM 800-160 MG PO TABS: 1.0000 | ORAL_TABLET | Freq: Every day | ORAL | 11 refills | Status: DC

## 2019-05-04 NOTE — Progress Notes (Signed)
Chief complaint: followup for HIV off medications  Subjective:    Patient ID: Juan Aguilar, male    DOB: 1976-01-02, 43 y.o.   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  We decided to strengthen his regimen by adding TIVICAY to his PREZCOBIX and exchanging his Truvada to DESCOVY.   He fell out of care for 3 years.  He had failed to come in to clinic to renew HIV medication assistance program.  His wife accompanied him states that they have not been sexually active since she knew that he was not taking his medications.  We restarted him on TIVICAY and Symtuza.  I thought he had been ADAP approved but it appears that certainly for the first month he relied on the bottle of TIVICAY and the bottle of Symtuza that I gave him.  He is now [employer] approved and hopefully is HMA P approved now as well.  He tells me that in the last week he had run out of [place] but still had a bottle of TIVICAY.  I provided him with another bottle of Symtuza today we will check on his HMA P program.         Past Medical History:  Diagnosis Date  . AIDS ([place]) 01/11/2015  . HIV infection ([facility])   . Noncompliance 01/11/2015    Past Surgical History:  Procedure Laterality Date  . none      Family History  Problem Relation Age of Onset  . Hypertension Father     Dad HTN   Social History   Socioeconomic History  . Marital status: Married    Spouse name: Not on file  . Number of children: Not on file  . Years of education: Not on file  . Highest education level: Not on file  Occupational History  . Not on file   Social Needs  . Financial resource strain: Not on file  . Food insecurity    Worry: Not on file    Inability: Not on file  . Transportation needs    Medical: Not on file    Non-medical: Not on file  Tobacco Use  . Smoking status: Former Smoker    Packs/day: 0.30    Types: Cigarettes    Start date: 12/21/2013  . Smokeless tobacco: Never Used  . Tobacco comment: quit  Substance and Sexual Activity  . Alcohol use: No    Alcohol/week: 0.0 standard drinks  . Drug use: No  . Sexual activity: Yes    Partners: Female    Comment: pt. given condoms  Lifestyle  . Physical activity    Days per week: Not on file    Minutes per session: Not on file  . Stress: Not on file  Relationships  . Social Herbalist on phone: Not on file    Gets together: Not on file    Attends religious service: Not on file    Active member of club or organization: Not on file    Attends meetings of clubs or organizations: Not on file    Relationship  status: Not on file  Other Topics Concern  . Not on file  Social History Narrative  . Not on file    No Known Allergies   Current Outpatient Medications:  .  Darunavir-Cobicisctat-Emtricitabine-Tenofovir Alafenamide (SYMTUZA) 800-150-200-10 MG TABS, Take 1 tablet by mouth daily with breakfast., Disp: 30 tablet, Rfl: 11 .  dolutegravir (TIVICAY) 50 MG tablet, Take 1 tablet (50 mg total) by mouth daily., Disp: 30 tablet, Rfl: 11 .  sulfamethoxazole-trimethoprim (BACTRIM DS) 800-160 MG tablet, Take 1 tablet by mouth daily., Disp: 30 tablet, Rfl: 4   Review of Systems  Constitutional: Negative for activity change, chills, diaphoresis, fever and unexpected weight change.  HENT: Negative for congestion, rhinorrhea, sinus pressure, sneezing, sore throat and trouble swallowing.   Eyes: Negative for photophobia and visual disturbance.  Respiratory: Negative for cough, chest tightness, shortness of breath, wheezing and stridor.   Cardiovascular: Negative  for chest pain and leg swelling.  Gastrointestinal: Negative for abdominal distention, abdominal pain, anal bleeding, blood in stool, constipation, diarrhea, nausea and vomiting.  Genitourinary: Negative for difficulty urinating, dysuria, flank pain and hematuria.  Musculoskeletal: Negative for arthralgias, back pain, gait problem, joint swelling and myalgias.  Skin: Negative for color change, pallor, rash and wound.  Neurological: Negative for dizziness, tremors, weakness and light-headedness.  Hematological: Negative for adenopathy. Does not bruise/bleed easily.  Psychiatric/Behavioral: Negative for agitation, behavioral problems, confusion, decreased concentration, dysphoric mood and sleep disturbance.       Objective:   Physical Exam  Constitutional: He is oriented to person, place, and time. He appears well-developed and well-nourished.  HENT:  Head: Normocephalic and atraumatic.  Eyes: Conjunctivae and EOM are normal.  Neck: Normal range of motion. Neck supple.  Cardiovascular: Normal rate and regular rhythm.  Pulmonary/Chest: Effort normal. No respiratory distress. He has no wheezes.  Abdominal: Soft. He exhibits no distension.  Musculoskeletal: Normal range of motion.        General: No tenderness, deformity or edema.  Neurological: He is alert and oriented to person, place, and time.  Skin: Skin is warm and dry. No rash noted. No erythema. No pallor.  Psychiatric: He has a normal mood and affect. His speech is normal and behavior is normal. Judgment and thought content normal. He is not slowed. Cognition and memory are normal.          Assessment & Plan:   HIV: MDR HIV, and genotypes reviewed cumulatively. Given his extensive PI R (but no DRAM): Continue his salvage regimen modern eyes now to [place] and TIVICAY   Continue Bactrim for PCP prevention  I gave him a new bottle of Symtuza today: See picture 2020:           We gave him a flu shot and Pneumovax  today  I spent greater than 25 minutes with the patient including greater than 50% of time in face to face counsel of the patient on the utmost importance of adhering to his antiretroviral therapy and in coordination of his care

## 2019-05-04 NOTE — Addendum Note (Signed)
Addended by: Aundria Rud on: 05/04/2019 10:32 AM   Modules accepted: Orders

## 2019-05-06 LAB — HELPER T-LYMPH-CD4 (ARMC ONLY)
% CD 4 Pos. Lymph.: 7.2 % — ABNORMAL LOW (ref 30.8–58.5)
Absolute CD 4 Helper: 108 /uL — ABNORMAL LOW (ref 359–1519)
Basophils Absolute: 0 10*3/uL (ref 0.0–0.2)
Basos: 1 %
EOS (ABSOLUTE): 0.3 10*3/uL (ref 0.0–0.4)
Eos: 6 %
Hematocrit: 41.8 % (ref 37.5–51.0)
Hemoglobin: 13.6 g/dL (ref 13.0–17.7)
Immature Grans (Abs): 0 10*3/uL (ref 0.0–0.1)
Immature Granulocytes: 0 %
Lymphocytes Absolute: 1.5 10*3/uL (ref 0.7–3.1)
Lymphs: 34 %
MCH: 29.2 pg (ref 26.6–33.0)
MCHC: 32.5 g/dL (ref 31.5–35.7)
MCV: 90 fL (ref 79–97)
Monocytes Absolute: 0.6 10*3/uL (ref 0.1–0.9)
Monocytes: 12 %
Neutrophils Absolute: 2.1 10*3/uL (ref 1.4–7.0)
Neutrophils: 47 %
Platelets: 221 10*3/uL (ref 150–450)
RBC: 4.65 x10E6/uL (ref 4.14–5.80)
RDW: 11.8 % (ref 11.6–15.4)
WBC: 4.5 10*3/uL (ref 3.4–10.8)

## 2019-05-11 LAB — HIV RNA, RTPCR W/R GT (RTI, PI,INT)
HIV 1 RNA Quant: 78 copies/mL — ABNORMAL HIGH
HIV-1 RNA Quant, Log: 1.89 Log copies/mL — ABNORMAL HIGH

## 2019-07-05 ENCOUNTER — Ambulatory Visit (INDEPENDENT_AMBULATORY_CARE_PROVIDER_SITE_OTHER): Payer: Self-pay | Admitting: Infectious Disease

## 2019-07-05 ENCOUNTER — Other Ambulatory Visit: Payer: Self-pay

## 2019-07-05 ENCOUNTER — Encounter: Payer: Self-pay | Admitting: Infectious Disease

## 2019-07-05 VITALS — BP 110/69 | HR 68 | Wt 133.0 lb

## 2019-07-05 DIAGNOSIS — Z9119 Patient's noncompliance with other medical treatment and regimen: Secondary | ICD-10-CM

## 2019-07-05 DIAGNOSIS — B20 Human immunodeficiency virus [HIV] disease: Secondary | ICD-10-CM

## 2019-07-05 DIAGNOSIS — Z91199 Patient's noncompliance with other medical treatment and regimen due to unspecified reason: Secondary | ICD-10-CM

## 2019-07-05 NOTE — Progress Notes (Signed)
Chief complaint: followup for HIV off medications  Subjective:    Patient ID: Juan Aguilar, male    DOB: [birth year], 43 y.o.   MRN: [id number]  HPI  43 year old [name] who upon review of past HIV genotypes has EXTENSIVE NNRTI, PI and even NRTI mutations who HAD been perfectly controlled on PREZCOBIX and Truvada but had stopped taking these meds in Spring OF 2016 because he felt they were "making him feel tired." when he resumed them he was not able to tolerate them well due to vomiting them for a while but eventually was able to take them.  We decided to strengthen his regimen by adding TIVICAY to his PREZCOBIX and exchanging his Truvada to DESCOVY.   He fell out of care for 3 years.  He had failed to come in to clinic to renew HIV medication assistance program.  We restarted him on [place].  I thought he had been ADAP approved but it appears that certainly for the first month he relied on the bottle of TIVICAY and the bottle of Symtuza that I gave him.  He is now [employer] approved and hopefully is HMA P approved now as well.  Virus coming under control    He apparently did not understand why he needs to take the Bactrim but now I understand that we will continue to take this along with his TIVICAY and Symtuza.       Past Medical History:  Diagnosis Date  . AIDS ([facility]) 01/11/2015  . HIV infection ([facility])   . Noncompliance 01/11/2015    Past Surgical History:  Procedure Laterality Date  . none      Family History  Problem Relation Age of Onset  . Hypertension Father     Dad HTN   Social History   Socioeconomic History  . Marital status: Married    Spouse name: Not on file  . Number of children: Not on file  . Years of education: Not on file  . Highest education level: Not on file  Occupational History  . Not on file  Social Needs  . Financial resource strain: Not on file  . Food insecurity    Worry: Not on file    Inability: Not on file  .  Transportation needs    Medical: Not on file    Non-medical: Not on file  Tobacco Use  . Smoking status: Former Smoker    Packs/day: 0.30    Types: Cigarettes    Start date: 12/21/2013  . Smokeless tobacco: Never Used  . Tobacco comment: quit  Substance and Sexual Activity  . Alcohol use: No    Alcohol/week: 0.0 standard drinks  . Drug use: No  . Sexual activity: Yes    Partners: Female    Comment: pt. given condoms  Lifestyle  . Physical activity    Days per week: Not on file    Minutes per session: Not on file  . Stress: Not on file  Relationships  . Social Musician on phone: Not on file    Gets together: Not on file    Attends religious service: Not on file    Active member of club or organization: Not on file    Attends meetings of clubs or organizations: Not on file    Relationship status: Not on file  Other Topics Concern  . Not on file  Social History Narrative  . Not on file    No Known Allergies  Current Outpatient Medications:  .  Darunavir-Cobicisctat-Emtricitabine-Tenofovir Alafenamide (SYMTUZA) 800-150-200-10 MG TABS, Take 1 tablet by mouth daily with breakfast., Disp: 30 tablet, Rfl: 11 .  dolutegravir (TIVICAY) 50 MG tablet, Take 1 tablet (50 mg total) by mouth daily., Disp: 30 tablet, Rfl: 11 .  sulfamethoxazole-trimethoprim (BACTRIM DS) 800-160 MG tablet, Take 1 tablet by mouth daily., Disp: 30 tablet, Rfl: 11   Review of Systems  Constitutional: Negative for activity change, chills, diaphoresis, fever and unexpected weight change.  HENT: Negative for congestion, rhinorrhea, sinus pressure, sneezing, sore throat and trouble swallowing.   Eyes: Negative for photophobia and visual disturbance.  Respiratory: Negative for cough, chest tightness, shortness of breath, wheezing and stridor.   Cardiovascular: Negative for chest pain and leg swelling.  Gastrointestinal: Negative for abdominal distention, abdominal pain, anal bleeding, blood in  stool, constipation, diarrhea, nausea and vomiting.  Genitourinary: Negative for difficulty urinating, dysuria, flank pain, frequency and hematuria.  Musculoskeletal: Negative for arthralgias, back pain, gait problem, joint swelling and myalgias.  Skin: Negative for color change, pallor, rash and wound.  Neurological: Negative for dizziness, tremors, weakness and light-headedness.  Hematological: Negative for adenopathy. Does not bruise/bleed easily.  Psychiatric/Behavioral: Negative for agitation, behavioral problems, confusion, decreased concentration, dysphoric mood and sleep disturbance.       Objective:   Physical Exam  Constitutional: He is oriented to person, place, and time. He appears well-developed and well-nourished.  HENT:  Head: Normocephalic and atraumatic.  Eyes: Conjunctivae and EOM are normal.  Neck: Normal range of motion. Neck supple. No JVD present.  Cardiovascular: Normal rate and regular rhythm.  Pulmonary/Chest: Effort normal. No respiratory distress. He has no wheezes.  Abdominal: Soft. He exhibits no distension.  Musculoskeletal: Normal range of motion.        General: No tenderness, deformity or edema.  Neurological: He is alert and oriented to person, place, and time.  Skin: Skin is warm and dry. No rash noted. No erythema. No pallor.  Psychiatric: He has a normal mood and affect. His speech is normal and behavior is normal. Judgment and thought content normal. He is not slowed. Cognition and memory are normal.          Assessment & Plan:   HIV: MDR HIV, and genotypes reviewed cumulatively. Given his extensive PI R (but no DRAM): Continue his salvage regimen modernized to [place] and [place]   Continue Bactrim for PCP prevention  We will check labs again today and have him come back in January for renewal of his [place] P program

## 2019-07-06 LAB — T-HELPER CELL (CD4) - (RCID CLINIC ONLY)
CD4 % Helper T Cell: 9 % — ABNORMAL LOW (ref 33–65)
CD4 T Cell Abs: 143 /uL — ABNORMAL LOW (ref 400–1790)

## 2019-07-16 LAB — HIV-1 RNA QUANT-NO REFLEX-BLD
HIV 1 RNA Quant: <20 copies/mL — AB
HIV-1 RNA Quant, Log: <1.3 Log copies/mL — AB

## 2019-12-22 ENCOUNTER — Other Ambulatory Visit: Payer: Self-pay

## 2019-12-22 ENCOUNTER — Ambulatory Visit: Payer: Self-pay

## 2019-12-22 DIAGNOSIS — Z113 Encounter for screening for infections with a predominantly sexual mode of transmission: Secondary | ICD-10-CM

## 2019-12-22 DIAGNOSIS — B20 Human immunodeficiency virus [HIV] disease: Secondary | ICD-10-CM

## 2019-12-22 NOTE — Addendum Note (Signed)
Addended byDoristine Devoid on: 12/22/2019 08:42 AM   Modules accepted: Orders

## 2019-12-23 ENCOUNTER — Other Ambulatory Visit: Payer: Self-pay

## 2019-12-23 ENCOUNTER — Ambulatory Visit: Payer: Self-pay

## 2019-12-23 LAB — URINE CYTOLOGY ANCILLARY ONLY
Chlamydia: NEGATIVE
Comment: NEGATIVE
Comment: NORMAL
Neisseria Gonorrhea: NEGATIVE

## 2019-12-23 LAB — T-HELPER CELL (CD4) - (RCID CLINIC ONLY)
CD4 % Helper T Cell: 9 % — ABNORMAL LOW (ref 33–65)
CD4 T Cell Abs: 129 /uL — ABNORMAL LOW (ref 400–1790)

## 2019-12-25 LAB — COMPLETE METABOLIC PANEL WITH GFR
AG Ratio: 1.3 (calc) (ref 1.0–2.5)
ALT: 16 U/L (ref 9–46)
AST: 21 U/L (ref 10–40)
Albumin: 4.4 g/dL (ref 3.6–5.1)
Alkaline phosphatase (APISO): 61 U/L (ref 36–130)
BUN: 16 mg/dL (ref 7–25)
CO2: 28 mmol/L (ref 20–32)
Calcium: 9.2 mg/dL (ref 8.6–10.3)
Chloride: 103 mmol/L (ref 98–110)
Creat: 0.87 mg/dL (ref 0.60–1.35)
GFR, Est African American: 123 mL/min/{1.73_m2} (ref >=60)
GFR, Est Non African American: 106 mL/min/{1.73_m2} (ref >=60)
Globulin: 3.3 g/dL (calc) (ref 1.9–3.7)
Glucose, Bld: 95 mg/dL (ref 65–99)
Potassium: 3.7 mmol/L (ref 3.5–5.3)
Sodium: 137 mmol/L (ref 135–146)
Total Bilirubin: 0.6 mg/dL (ref 0.2–1.2)
Total Protein: 7.7 g/dL (ref 6.1–8.1)

## 2019-12-25 LAB — CBC WITH DIFFERENTIAL/PLATELET
Absolute Monocytes: 529 cells/uL (ref 200–950)
Basophils Absolute: 32 cells/uL (ref 0–200)
Basophils Relative: 0.7 %
Eosinophils Absolute: 451 cells/uL (ref 15–500)
Eosinophils Relative: 9.8 %
HCT: 42.2 % (ref 38.5–50.0)
Hemoglobin: 14 g/dL (ref 13.2–17.1)
Lymphs Abs: 1320 cells/uL (ref 850–3900)
MCH: 30 pg (ref 27.0–33.0)
MCHC: 33.2 g/dL (ref 32.0–36.0)
MCV: 90.4 fL (ref 80.0–100.0)
MPV: 10.4 fL (ref 7.5–12.5)
Monocytes Relative: 11.5 %
Neutro Abs: 2268 cells/uL (ref 1500–7800)
Neutrophils Relative %: 49.3 %
Platelets: 224 10*3/uL (ref 140–400)
RBC: 4.67 10*6/uL (ref 4.20–5.80)
RDW: 11.1 % (ref 11.0–15.0)
Total Lymphocyte: 28.7 %
WBC: 4.6 10*3/uL (ref 3.8–10.8)

## 2019-12-25 LAB — HEPATITIS B CORE ANTIBODY, TOTAL: Hep B Core Total Ab: REACTIVE — AB

## 2019-12-25 LAB — HLA B*5701: HLA-B*5701 w/rflx HLA-B High: NEGATIVE

## 2019-12-25 LAB — HEPATITIS C ANTIBODY
Hepatitis C Ab: NONREACTIVE
SIGNAL TO CUT-OFF: 0.02 (ref <1.00)

## 2019-12-25 LAB — RPR: RPR Ser Ql: NONREACTIVE

## 2019-12-25 LAB — HIV-1/2 AB - DIFFERENTIATION
HIV-1 antibody: POSITIVE — AB
HIV-2 Ab: NEGATIVE

## 2019-12-25 LAB — HIV-1 RNA QUANT-NO REFLEX-BLD
HIV 1 RNA Quant: 392 copies/mL — ABNORMAL HIGH
HIV-1 RNA Quant, Log: 2.59 Log copies/mL — ABNORMAL HIGH

## 2019-12-25 LAB — HEPATITIS B SURFACE ANTIGEN: Hepatitis B Surface Ag: NONREACTIVE

## 2019-12-25 LAB — HIV ANTIBODY (ROUTINE TESTING W REFLEX): HIV 1&2 Ab, 4th Generation: REACTIVE — AB

## 2019-12-25 LAB — HEPATITIS B SURFACE ANTIBODY,QUALITATIVE: Hep B S Ab: BORDERLINE — AB

## 2019-12-25 LAB — HEPATITIS A ANTIBODY, TOTAL: Hepatitis A AB,Total: REACTIVE — AB

## 2020-01-11 ENCOUNTER — Telehealth: Payer: Self-pay

## 2020-01-11 NOTE — Telephone Encounter (Signed)
COVID-19 Pre-Screening Questions:01/11/20  Do you currently have a fever (>100 F), chills or unexplained body aches? NO  Are you currently experiencing new cough, shortness of breath, sore throat, runny nose? NO .  Have you recently travelled outside the state of [place] in the last 14 days? NO .  Have you been in contact with someone that is currently pending confirmation of Covid19 testing or has been confirmed to have the Covid19 virus? NO  **If the patient answers NO to ALL questions -  advise the patient to please call the clinic before coming to the office should any symptoms develop.     

## 2020-01-12 ENCOUNTER — Ambulatory Visit (INDEPENDENT_AMBULATORY_CARE_PROVIDER_SITE_OTHER): Payer: Self-pay | Admitting: Infectious Disease

## 2020-01-12 ENCOUNTER — Encounter: Payer: Self-pay | Admitting: Infectious Disease

## 2020-01-12 ENCOUNTER — Other Ambulatory Visit: Payer: Self-pay

## 2020-01-12 ENCOUNTER — Telehealth: Payer: Self-pay | Admitting: Pharmacy Technician

## 2020-01-12 VITALS — BP 115/73 | HR 78 | Temp 98.3°F | Wt 136.0 lb

## 2020-01-12 DIAGNOSIS — Z91199 Patient's noncompliance with other medical treatment and regimen due to unspecified reason: Secondary | ICD-10-CM

## 2020-01-12 DIAGNOSIS — B20 Human immunodeficiency virus [HIV] disease: Secondary | ICD-10-CM

## 2020-01-12 DIAGNOSIS — Z9119 Patient's noncompliance with other medical treatment and regimen: Secondary | ICD-10-CM

## 2020-01-12 NOTE — Progress Notes (Signed)
Chief complaint: followup for HIV off medications  Subjective:    Patient ID: Juan Aguilar, male    DOB: June 11, 1976, 44 y.o.   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  We decided to strengthen his regimen by adding TIVICAY to his PREZCOBIX and exchanging his Truvada to DESCOVY.   He fell out of care for 3 years.  He had failed to come in to clinic to renew HIV medication assistance program.  We restarted him on [place].  He failed to do his H map on time this winter again and is still in the process of having it approved he claims to have antivirals at home though I am concerned he may not.  He also claims to have Bactrim.        Past Medical History:  Diagnosis Date  . AIDS ([place]) 01/11/2015  . HIV infection ([place])   . Noncompliance 01/11/2015    Past Surgical History:  Procedure Laterality Date  . none      Family History  Problem Relation Age of Onset  . Hypertension Father     Dad HTN   Social History   Socioeconomic History  . Marital status: Married    Spouse name: Not on file  . Number of children: Not on file  . Years of education: Not on file  . Highest education level: Not on file  Occupational History  . Not on file  Tobacco Use  . Smoking status: Former Smoker    Packs/day: 0.30    Types: Cigarettes    Start date: 12/21/2013  . Smokeless tobacco: Never Used  . Tobacco comment: quit  Substance and Sexual Activity  . Alcohol use: No    Alcohol/week: 0.0 standard drinks  . Drug use: No  . Sexual activity: Yes    Partners: Female    Comment: pt. given  condoms  Other Topics Concern  . Not on file  Social History Narrative  . Not on file   Social Determinants of Health   Financial Resource Strain:   . Difficulty of Paying Living Expenses:   Food Insecurity:   . Worried About Charity fundraiser in the Last Year:   . Arboriculturist in the Last Year:   Transportation Needs:   . Film/video editor (Medical):   Marland Kitchen Lack of Transportation (Non-Medical):   Physical Activity:   . Days of Exercise per Week:   . Minutes of Exercise per Session:   Stress:   . Feeling of Stress :   Social Connections:   . Frequency of Communication with Friends and Family:   . Frequency of Social Gatherings with Friends and Family:   . Attends Religious Services:   . Active Member of Clubs or Organizations:   . Attends Archivist Meetings:   Marland Kitchen Marital Status:     No Known Allergies   Current Outpatient Medications:  .  Darunavir-Cobicisctat-Emtricitabine-Tenofovir Alafenamide (SYMTUZA) 800-150-200-10 MG TABS, Take 1 tablet by mouth daily with breakfast., Disp: 30 tablet, Rfl: 11 .  dolutegravir (TIVICAY) 50 MG  tablet, Take 1 tablet (50 mg total) by mouth daily., Disp: 30 tablet, Rfl: 11 .  sulfamethoxazole-trimethoprim (BACTRIM DS) 800-160 MG tablet, Take 1 tablet by mouth daily., Disp: 30 tablet, Rfl: 11   Review of Systems  Constitutional: Negative for activity change, chills, diaphoresis, fever and unexpected weight change.  HENT: Negative for congestion, rhinorrhea, sinus pressure, sneezing, sore throat and trouble swallowing.   Eyes: Negative for photophobia and visual disturbance.  Respiratory: Negative for cough, chest tightness, shortness of breath, wheezing and stridor.   Cardiovascular: Negative for chest pain and leg swelling.  Gastrointestinal: Negative for abdominal distention, abdominal pain, anal bleeding, blood in stool, constipation, diarrhea, nausea and vomiting.  Genitourinary: Negative for difficulty urinating,  dysuria, flank pain, frequency and hematuria.  Musculoskeletal: Negative for arthralgias, back pain, gait problem, joint swelling and myalgias.  Skin: Negative for color change, pallor, rash and wound.  Neurological: Negative for dizziness, tremors, weakness and light-headedness.  Hematological: Negative for adenopathy. Does not bruise/bleed easily.  Psychiatric/Behavioral: Negative for agitation, behavioral problems, confusion, decreased concentration, dysphoric mood and sleep disturbance.       Objective:   Physical Exam  Constitutional: He is oriented to person, place, and time. He appears well-developed and well-nourished.  HENT:  Head: Normocephalic and atraumatic.  Eyes: Conjunctivae and EOM are normal.  Neck: No JVD present.  Cardiovascular: Normal rate and regular rhythm.  Pulmonary/Chest: Effort normal. No stridor. No respiratory distress. He has no wheezes.  Abdominal: Soft. He exhibits no distension.  Musculoskeletal:        General: No tenderness, deformity or edema. Normal range of motion.     Cervical back: Normal range of motion and neck supple.  Neurological: He is alert and oriented to person, place, and time.  Skin: Skin is warm and dry. No rash noted. No erythema. No pallor.  Psychiatric: He has a normal mood and affect. His speech is normal and behavior is normal. Judgment and thought content normal. He is not slowed. Cognition and memory are normal.          Assessment & Plan:   HIV: MDR HIV, and genotypes reviewed cumulatively. Given his extensive PI R (but no DRAM): Continue his salvage regimen modernized to [place]   Will try to get Tivicay using ViiV + SYMTUZA samples we have, hopefully HMAP done quickly  Continue Bactrim for PCP prevention  We will check labs again today and have him come back in July for renewal of his HMA P program

## 2020-01-12 NOTE — Telephone Encounter (Signed)
[facility] Patient Advocate Encounter  Met with the patient today after his financial appointment with Timmothy Sours. He completed all the necessary pieces for his adap application to be sent today. I checked in to see if he needed patient assistance with ViiV for his Tivicay and he said he had one and a half bottles of that medication at home and half bottle of Symtuza. He was provided with a sample bottle of Symtuza before leaving so that he would have equal amounts of both medications. He knows to take them together. He will go to [facility] for medication when adap is approved.

## 2020-01-12 NOTE — Telephone Encounter (Signed)
Thanks so much Juan Aguilar! 

## 2020-01-12 NOTE — Addendum Note (Signed)
Addended by: Mariea Clonts D on: 01/12/2020 11:40 AM   Modules accepted: Orders

## 2020-01-13 ENCOUNTER — Encounter: Payer: Self-pay | Admitting: Infectious Disease

## 2020-01-14 ENCOUNTER — Other Ambulatory Visit: Payer: Self-pay

## 2020-01-17 LAB — HIV RNA, RTPCR W/R GT (RTI, PI,INT)
HIV 1 RNA Quant: <20 copies/mL
HIV-1 RNA Quant, Log: <1.3 Log copies/mL

## 2020-02-07 ENCOUNTER — Encounter: Payer: Self-pay | Admitting: Infectious Disease

## 2020-02-23 ENCOUNTER — Encounter: Payer: Self-pay | Admitting: Infectious Disease

## 2020-02-23 ENCOUNTER — Ambulatory Visit: Payer: Self-pay

## 2020-02-23 ENCOUNTER — Other Ambulatory Visit: Payer: Self-pay

## 2020-02-24 ENCOUNTER — Ambulatory Visit: Payer: Self-pay | Admitting: Infectious Disease

## 2020-05-02 ENCOUNTER — Ambulatory Visit: Payer: Self-pay | Admitting: Infectious Disease

## 2020-07-05 ENCOUNTER — Other Ambulatory Visit: Payer: Self-pay

## 2020-07-05 DIAGNOSIS — B20 Human immunodeficiency virus [HIV] disease: Secondary | ICD-10-CM

## 2020-07-06 LAB — T-HELPER CELL (CD4) - (RCID CLINIC ONLY)
CD4 % Helper T Cell: 11 % — ABNORMAL LOW (ref 33–65)
CD4 T Cell Abs: 173 /uL — ABNORMAL LOW (ref 400–1790)

## 2020-07-07 LAB — CBC WITH DIFFERENTIAL/PLATELET
Absolute Monocytes: 795 cells/uL (ref 200–950)
Basophils Absolute: 49 cells/uL (ref 0–200)
Basophils Relative: 0.6 %
Eosinophils Absolute: 320 cells/uL (ref 15–500)
Eosinophils Relative: 3.9 %
HCT: 42.2 % (ref 38.5–50.0)
Hemoglobin: 13.8 g/dL (ref 13.2–17.1)
Lymphs Abs: 1763 cells/uL (ref 850–3900)
MCH: 29.9 pg (ref 27.0–33.0)
MCHC: 32.7 g/dL (ref 32.0–36.0)
MCV: 91.3 fL (ref 80.0–100.0)
MPV: 11 fL (ref 7.5–12.5)
Monocytes Relative: 9.7 %
Neutro Abs: 5273 cells/uL (ref 1500–7800)
Neutrophils Relative %: 64.3 %
Platelets: 195 10*3/uL (ref 140–400)
RBC: 4.62 10*6/uL (ref 4.20–5.80)
RDW: 11.5 % (ref 11.0–15.0)
Total Lymphocyte: 21.5 %
WBC: 8.2 10*3/uL (ref 3.8–10.8)

## 2020-07-07 LAB — COMPLETE METABOLIC PANEL WITH GFR
AG Ratio: 1.5 (calc) (ref 1.0–2.5)
ALT: 16 U/L (ref 9–46)
AST: 18 U/L (ref 10–40)
Albumin: 4.3 g/dL (ref 3.6–5.1)
Alkaline phosphatase (APISO): 56 U/L (ref 36–130)
BUN: 16 mg/dL (ref 7–25)
CO2: 26 mmol/L (ref 20–32)
Calcium: 9.2 mg/dL (ref 8.6–10.3)
Chloride: 104 mmol/L (ref 98–110)
Creat: 0.96 mg/dL (ref 0.60–1.35)
GFR, Est African American: 111 mL/min/{1.73_m2} (ref >=60)
GFR, Est Non African American: 96 mL/min/{1.73_m2} (ref >=60)
Globulin: 2.9 g/dL (calc) (ref 1.9–3.7)
Glucose, Bld: 90 mg/dL (ref 65–99)
Potassium: 4.1 mmol/L (ref 3.5–5.3)
Sodium: 137 mmol/L (ref 135–146)
Total Bilirubin: 0.5 mg/dL (ref 0.2–1.2)
Total Protein: 7.2 g/dL (ref 6.1–8.1)

## 2020-07-07 LAB — RPR: RPR Ser Ql: NONREACTIVE

## 2020-07-07 LAB — HIV-1 RNA QUANT-NO REFLEX-BLD
HIV 1 RNA Quant: <20 Copies/mL — ABNORMAL HIGH
HIV-1 RNA Quant, Log: <1.3 Log cps/mL — ABNORMAL HIGH

## 2020-07-19 ENCOUNTER — Encounter: Payer: Self-pay | Admitting: Infectious Disease

## 2020-07-19 ENCOUNTER — Other Ambulatory Visit: Payer: Self-pay

## 2020-07-19 ENCOUNTER — Ambulatory Visit: Payer: Self-pay | Admitting: Infectious Disease

## 2020-07-19 DIAGNOSIS — R634 Abnormal weight loss: Secondary | ICD-10-CM | POA: Insufficient documentation

## 2020-07-19 DIAGNOSIS — B20 Human immunodeficiency virus [HIV] disease: Secondary | ICD-10-CM

## 2020-07-19 DIAGNOSIS — J029 Acute pharyngitis, unspecified: Secondary | ICD-10-CM

## 2020-07-19 HISTORY — DX: Acute pharyngitis, unspecified: J02.9

## 2020-07-19 HISTORY — DX: Abnormal weight loss: R63.4

## 2020-07-19 MED ORDER — SYMTUZA 800-150-200-10 MG PO TABS
1.0000 | ORAL_TABLET | Freq: Every day | ORAL | 11 refills | Status: DC
Start: 2020-07-19 — End: 2021-07-05

## 2020-07-19 MED ORDER — SULFAMETHOXAZOLE-TRIMETHOPRIM 800-160 MG PO TABS: 1.0000 | ORAL_TABLET | Freq: Every day | ORAL | 11 refills | Status: DC

## 2020-07-19 MED ORDER — TIVICAY 50 MG PO TABS: 50.0000 mg | ORAL_TABLET | Freq: Every day | ORAL | 11 refills | Status: DC

## 2020-07-19 NOTE — Progress Notes (Signed)
Chief complaint: followup for HIV off medications had sore throate and weight loss several weeks ago  Subjective:    Patient ID: Juan Aguilar, male    DOB: October 12, 1975, 44 y.o.   MRN: [id number]  HPI  44 year old [name] who upon review of past HIV genotypes has EXTENSIVE NNRTI, PI and even NRTI mutations who HAD been perfectly controlled on PREZCOBIX and Truvada but had stopped taking these meds in Spring OF 2016 because he felt they were "making him feel tired." when he resumed them he was not able to tolerate them well due to vomiting them for a while but eventually was able to take them.  We decided to strengthen his regimen by adding TIVICAY to his PREZCOBIX and exchanging his Truvada to DESCOVY.   He fell out of care for 3 years.  He had failed to come in to clinic to renew HIV medication assistance program and came back with VL over 200K and CD4 abysmally low   He is not on TIVICAY and Symtuza and VL is suppressed though CD4 still low  He has not had COVID or flu vaccines  He had sore throat and weight loss more than a month ago without a diagnosis.        Past Medical History:  Diagnosis Date  . AIDS ([facility]) 01/11/2015  . HIV infection ([facility])   . Noncompliance 01/11/2015    Past Surgical History:  Procedure Laterality Date  . none      Family History  Problem Relation Age of Onset  . Hypertension Father     Dad HTN   Social History   Socioeconomic History  . Marital status: Married    Spouse name: Not on file  . Number of children: Not on file  . Years of education: Not on file  . Highest education level: Not on file  Occupational History  . Not on file  Tobacco Use  . Smoking status: Former Smoker    Packs/day: 0.30    Types: Cigarettes    Start date: 12/21/2013  . Smokeless tobacco: Never Used  . Tobacco comment: quit  Substance and Sexual Activity  . Alcohol use: No    Alcohol/week: 0.0 standard drinks  . Drug use: No  . Sexual activity: Yes     Partners: Female    Comment: pt. given condoms  Other Topics Concern  . Not on file  Social History Narrative  . Not on file   Social Determinants of Health   Financial Resource Strain:   . Difficulty of Paying Living Expenses: Not on file  Food Insecurity:   . Worried About Programme researcher, broadcasting/film/video in the Last Year: Not on file  . Ran Out of Food in the Last Year: Not on file  Transportation Needs:   . Lack of Transportation (Medical): Not on file  . Lack of Transportation (Non-Medical): Not on file  Physical Activity:   . Days of Exercise per Week: Not on file  . Minutes of Exercise per Session: Not on file  Stress:   . Feeling of Stress : Not on file  Social Connections:   . Frequency of Communication with Friends and Family: Not on file  . Frequency of Social Gatherings with Friends and Family: Not on file  . Attends Religious Services: Not on file  . Active Member of Clubs or Organizations: Not on file  . Attends Banker Meetings: Not on file  . Marital Status: Not on file  No Known Allergies   Current Outpatient Medications:  .  Darunavir-Cobicisctat-Emtricitabine-Tenofovir Alafenamide (SYMTUZA) 800-150-200-10 MG TABS, Take 1 tablet by mouth daily with breakfast., Disp: 30 tablet, Rfl: 11 .  dolutegravir (TIVICAY) 50 MG tablet, Take 1 tablet (50 mg total) by mouth daily., Disp: 30 tablet, Rfl: 11 .  sulfamethoxazole-trimethoprim (BACTRIM DS) 800-160 MG tablet, Take 1 tablet by mouth daily., Disp: 30 tablet, Rfl: 11   Review of Systems  Constitutional: Positive for unexpected weight change. Negative for activity change, chills, diaphoresis and fever.  HENT: Positive for sore throat. Negative for congestion, rhinorrhea, sinus pressure, sneezing and trouble swallowing.   Eyes: Negative for photophobia and visual disturbance.  Respiratory: Negative for cough, chest tightness, shortness of breath, wheezing and stridor.   Cardiovascular: Negative for chest  pain and leg swelling.  Gastrointestinal: Negative for abdominal distention, abdominal pain, anal bleeding, blood in stool, constipation, diarrhea, nausea and vomiting.  Genitourinary: Negative for difficulty urinating, dysuria, flank pain, frequency and hematuria.  Musculoskeletal: Negative for arthralgias, back pain, gait problem, joint swelling and myalgias.  Skin: Negative for color change, pallor, rash and wound.  Neurological: Negative for dizziness, tremors, weakness and light-headedness.  Hematological: Negative for adenopathy. Does not bruise/bleed easily.  Psychiatric/Behavioral: Negative for agitation, behavioral problems, confusion, decreased concentration, dysphoric mood and sleep disturbance.       Objective:   Physical Exam Constitutional:      Appearance: He is well-developed.  HENT:     Head: Normocephalic and atraumatic.  Eyes:     Conjunctiva/sclera: Conjunctivae normal.  Neck:     Vascular: No JVD.  Cardiovascular:     Rate and Rhythm: Normal rate and regular rhythm.  Pulmonary:     Effort: Pulmonary effort is normal. No respiratory distress.     Breath sounds: No stridor. No wheezing.  Abdominal:     General: There is no distension.     Palpations: Abdomen is soft.  Musculoskeletal:        General: No tenderness or deformity. Normal range of motion.     Cervical back: Normal range of motion and neck supple.  Skin:    General: Skin is warm and dry.     Coloration: Skin is not pale.     Findings: No erythema or rash.  Neurological:     Mental Status: He is alert and oriented to person, place, and time.  Psychiatric:        Speech: Speech normal.        Behavior: Behavior normal. Behavior is not slowed.        Thought Content: Thought content normal.        Judgment: Judgment normal.           Assessment & Plan:   HIV: MDR HIV, and genotypes reviewed cumulatively. Given his extensive PI R (but no DRAM): Continue his salvage regimen modernized to  [place]   Continue Bactrim for PCP prevention  Sore throat and weight loss: could have been COVID and perhaps should have gotten serology on him. He has regained weight he lost since.  I would like him to get MRNA vaccines and we will schedule her in clinic

## 2020-07-28 ENCOUNTER — Ambulatory Visit: Payer: Self-pay

## 2020-08-31 ENCOUNTER — Encounter: Payer: Self-pay | Admitting: Infectious Disease

## 2020-08-31 ENCOUNTER — Ambulatory Visit: Payer: Self-pay

## 2020-08-31 ENCOUNTER — Other Ambulatory Visit: Payer: Self-pay

## 2020-08-31 ENCOUNTER — Ambulatory Visit (INDEPENDENT_AMBULATORY_CARE_PROVIDER_SITE_OTHER): Payer: Self-pay | Admitting: Infectious Disease

## 2020-08-31 VITALS — BP 123/75 | HR 74 | Temp 98.3°F | Wt 141.0 lb

## 2020-08-31 DIAGNOSIS — Z23 Encounter for immunization: Secondary | ICD-10-CM

## 2020-08-31 DIAGNOSIS — B20 Human immunodeficiency virus [HIV] disease: Secondary | ICD-10-CM

## 2020-08-31 NOTE — Progress Notes (Signed)
   Covid-19 Vaccination Clinic  Name:  Claus Silvestro    MRN: [id number] DOB: 10/12/1975  08/31/2020  Mr. Sumler was observed post Covid-19 immunization for 15 minutes without incident. He was provided with Vaccine Information Sheet and instruction to access the V-Safe system.   Mr. Wyndham was instructed to call 911 with any severe reactions post vaccine: Marland Kitchen Difficulty breathing  . Swelling of face and throat  . A fast heartbeat  . A bad rash all over body  . Dizziness and weakness  Elison Worrel T Pricilla Loveless

## 2020-08-31 NOTE — Progress Notes (Signed)
Chief complaint: followup for HIV off medications   Subjective:    Patient ID: Juan Aguilar, male    DOB: 1976/05/21, 45 y.o.   MRN: [id number]  HPI  45 year old [name] who upon review of past HIV genotypes has EXTENSIVE NNRTI, PI and even NRTI mutations who HAD been perfectly controlled on PREZCOBIX and Truvada but had stopped taking these meds in Spring OF 2016 because he felt they were "making him feel tired." when he resumed them he was not able to tolerate them well due to vomiting them for a while but eventually was able to take them.  We decided to strengthen his regimen by adding TIVICAY to his PREZCOBIX and exchanging his Truvada to DESCOVY.   He fell out of care for 3 years.  He had failed to come in to clinic to renew HIV medication assistance program and came back with VL over 200K and CD4 abysmally low   He is now on TIVICAY and Symtuza and VL is suppressed though CD4 still low  Viral load is suppressed CD4 count is climbing but not yet above 200.      Past Medical History:  Diagnosis Date  . AIDS ([facility]) 01/11/2015  . HIV infection ([facility])   . Noncompliance 01/11/2015  . Sore throat 07/19/2020  . Weight loss 07/19/2020    Past Surgical History:  Procedure Laterality Date  . none      Family History  Problem Relation Age of Onset  . Hypertension Father     Dad HTN   Social History   Socioeconomic History  . Marital status: Married    Spouse name: Not on file  . Number of children: Not on file  . Years of education: Not on file  . Highest education level: Not on file  Occupational History  . Not on file  Tobacco Use  . Smoking status: Former Smoker    Packs/day: 0.30    Types: Cigarettes    Start date: 12/21/2013  . Smokeless tobacco: Never Used  . Tobacco comment: quit  Substance and Sexual Activity  . Alcohol use: No    Alcohol/week: 0.0 standard drinks  . Drug use: No  . Sexual activity: Yes    Partners: Female    Comment: pt. given  condoms 08/31/20  Other Topics Concern  . Not on file  Social History Narrative  . Not on file   Social Determinants of Health   Financial Resource Strain: Not on file  Food Insecurity: Not on file  Transportation Needs: Not on file  Physical Activity: Not on file  Stress: Not on file  Social Connections: Not on file    No Known Allergies   Current Outpatient Medications:  .  Darunavir-Cobicisctat-Emtricitabine-Tenofovir Alafenamide (SYMTUZA) 800-150-200-10 MG TABS, Take 1 tablet by mouth daily with breakfast., Disp: 30 tablet, Rfl: 11 .  dolutegravir (TIVICAY) 50 MG tablet, Take 1 tablet (50 mg total) by mouth daily., Disp: 30 tablet, Rfl: 11 .  sulfamethoxazole-trimethoprim (BACTRIM DS) 800-160 MG tablet, Take 1 tablet by mouth daily., Disp: 30 tablet, Rfl: 11   Review of Systems  Constitutional: Negative for activity change, chills, diaphoresis and fever.  HENT: Negative for congestion, rhinorrhea, sinus pressure, sneezing, sore throat and trouble swallowing.   Eyes: Negative for photophobia and visual disturbance.  Respiratory: Negative for cough, chest tightness, shortness of breath, wheezing and stridor.   Cardiovascular: Negative for chest pain and leg swelling.  Gastrointestinal: Negative for abdominal distention, abdominal pain, anal bleeding, blood  in stool, constipation, diarrhea, nausea and vomiting.  Genitourinary: Negative for difficulty urinating, dysuria, flank pain, frequency and hematuria.  Musculoskeletal: Negative for arthralgias, back pain, gait problem, joint swelling and myalgias.  Skin: Negative for color change, pallor, rash and wound.  Neurological: Negative for dizziness, tremors, weakness and light-headedness.  Hematological: Negative for adenopathy. Does not bruise/bleed easily.  Psychiatric/Behavioral: Negative for agitation, behavioral problems, confusion, decreased concentration, dysphoric mood and sleep disturbance.       Objective:   Physical  Exam Constitutional:      Appearance: He is well-developed.  HENT:     Head: Normocephalic and atraumatic.  Eyes:     Conjunctiva/sclera: Conjunctivae normal.  Neck:     Vascular: No JVD.  Cardiovascular:     Rate and Rhythm: Normal rate and regular rhythm.  Pulmonary:     Effort: Pulmonary effort is normal. No respiratory distress.     Breath sounds: No stridor. No wheezing.  Abdominal:     General: There is no distension.     Palpations: Abdomen is soft.  Musculoskeletal:        General: No tenderness or deformity. Normal range of motion.     Cervical back: Normal range of motion and neck supple.  Skin:    General: Skin is warm and dry.     Coloration: Skin is not pale.     Findings: No erythema or rash.  Neurological:     General: No focal deficit present.     Mental Status: He is alert and oriented to person, place, and time.  Psychiatric:        Mood and Affect: Mood normal.        Speech: Speech normal.        Behavior: Behavior normal. Behavior is not slowed.        Thought Content: Thought content normal.        Judgment: Judgment normal.           Assessment & Plan:   HIV: MDR HIV, and genotypes reviewed cumulatively. Given his extensive PI R (but no DRAM): Continue his salvage regimen of symtuza and Tivicay and recheck labs in 3 months when we will see him again.  Could consider adding Rukobia to help get CD4 higher  Continue Bactrim for PCP prevention  COVID prevention: give pfizer number one today

## 2020-09-20 ENCOUNTER — Other Ambulatory Visit: Payer: Self-pay

## 2020-09-20 ENCOUNTER — Ambulatory Visit (INDEPENDENT_AMBULATORY_CARE_PROVIDER_SITE_OTHER): Payer: Self-pay

## 2020-09-20 DIAGNOSIS — Z23 Encounter for immunization: Secondary | ICD-10-CM

## 2020-09-20 NOTE — Progress Notes (Signed)
   Covid-19 Vaccination Clinic  Name:  Fenris Cauble    MRN: [id number] DOB: 1975/09/07  09/20/2020  Mr. Tetterton was observed post Covid-19 immunization for 15 minutes without incident. He was provided with Vaccine Information Sheet and instruction to access the V-Safe system.   Mr. Canal was instructed to call 911 with any severe reactions post vaccine: Marland Kitchen Difficulty breathing  . Swelling of face and throat  . A fast heartbeat  . A bad rash all over body  . Dizziness and weakness     Sandie Ano, RN

## 2020-10-16 ENCOUNTER — Other Ambulatory Visit: Payer: Self-pay | Admitting: Infectious Disease

## 2020-12-12 ENCOUNTER — Ambulatory Visit: Payer: Self-pay | Admitting: Infectious Disease

## 2020-12-20 ENCOUNTER — Ambulatory Visit: Payer: Self-pay | Admitting: Infectious Disease

## 2020-12-20 ENCOUNTER — Encounter: Payer: Self-pay | Admitting: Infectious Disease

## 2020-12-20 ENCOUNTER — Ambulatory Visit: Payer: Self-pay

## 2020-12-20 ENCOUNTER — Other Ambulatory Visit: Payer: Self-pay

## 2020-12-20 VITALS — BP 109/67 | HR 73 | Wt 138.0 lb

## 2020-12-20 DIAGNOSIS — B20 Human immunodeficiency virus [HIV] disease: Secondary | ICD-10-CM

## 2020-12-20 DIAGNOSIS — Z23 Encounter for immunization: Secondary | ICD-10-CM

## 2020-12-20 NOTE — Progress Notes (Signed)
   Covid-19 Vaccination Clinic  Name:  Juan Aguilar    MRN: [id number] DOB: [birth year]  12/20/2020  Mr. [name] was observed post Covid-19 immunization for 15 minutes without incident. He was provided with Vaccine Information Sheet and instruction to access the V-Safe system.   Mr. [name] was instructed to call 911 with any severe reactions post vaccine: Marland Kitchen Difficulty breathing  . Swelling of face and throat  . A fast heartbeat  . A bad rash all over body  . Dizziness and weakness      Rosanna Randy, RN

## 2020-12-20 NOTE — Progress Notes (Signed)
Chief complaint: followup for HIV off medications   Subjective:  Chief complaint follow-up for HIV disease on medication  Patient ID: Juan Aguilar, male    DOB: Jun 30, 1976, 45 y.o.   MRN: [id number]  HPI  44 year old [name] who upon review of past HIV genotypes has EXTENSIVE NNRTI, PI and even NRTI mutations who HAD been perfectly controlled on PREZCOBIX and Truvada but had stopped taking these meds in Spring OF 2016 because he felt they were "making him feel tired." when he resumed them he was not able to tolerate them well due to vomiting them for a while but eventually was able to take them.  We decided to strengthen his regimen by adding TIVICAY to his PREZCOBIX and exchanging his Truvada to DESCOVY.   He fell out of care for 3 years.  He had failed to come in to clinic to renew HIV medication assistance program and came back with VL over 200K and CD4 abysmally low   He is now on TIVICAY and Symtuza and VL is suppressed though CD4 still low  His second COVID-19 vaccination and we gave him a third Pfizer dose today  Past Medical History:  Diagnosis Date  . AIDS (HCC) 01/11/2015  . HIV infection (HCC)   . Noncompliance 01/11/2015  . Sore throat 07/19/2020  . Weight loss 07/19/2020    Past Surgical History:  Procedure Laterality Date  . none      Family History  Problem Relation Age of Onset  . Hypertension Father     Dad HTN   Social History   Socioeconomic History  . Marital status: Married    Spouse name: Not on file  . Number of children: Not on file  . Years of education: Not on file  . Highest education level: Not on file  Occupational History  . Not on file  Tobacco Use  . Smoking status: Former Smoker    Packs/day: 0.30    Types: Cigarettes    Start date: 12/21/2013  . Smokeless tobacco: Never Used  . Tobacco comment: quit  Substance and Sexual Activity  . Alcohol use: No    Alcohol/week: 0.0 standard drinks  . Drug use: No  . Sexual activity: Yes     Partners: Female    Comment: pt. given condoms 08/31/20  Other Topics Concern  . Not on file  Social History Narrative  . Not on file   Social Determinants of Health   Financial Resource Strain: Not on file  Food Insecurity: Not on file  Transportation Needs: Not on file  Physical Activity: Not on file  Stress: Not on file  Social Connections: Not on file    No Known Allergies   Current Outpatient Medications:  .  Darunavir-Cobicisctat-Emtricitabine-Tenofovir Alafenamide (SYMTUZA) 800-150-200-10 MG TABS, Take 1 tablet by mouth daily with breakfast., Disp: 30 tablet, Rfl: 11 .  dolutegravir (TIVICAY) 50 MG tablet, Take 1 tablet (50 mg total) by mouth daily., Disp: 30 tablet, Rfl: 11 .  sulfamethoxazole-trimethoprim (BACTRIM DS) 800-160 MG tablet, Take 1 tablet by mouth daily., Disp: 30 tablet, Rfl: 11   Review of Systems  Constitutional: Negative for activity change, chills, diaphoresis and fever.  HENT: Negative for congestion, rhinorrhea, sinus pressure, sneezing, sore throat and trouble swallowing.   Eyes: Negative for photophobia and visual disturbance.  Respiratory: Negative for cough, chest tightness, shortness of breath, wheezing and stridor.   Cardiovascular: Negative for chest pain and leg swelling.  Gastrointestinal: Negative for abdominal distention, abdominal pain,  anal bleeding, blood in stool, constipation, diarrhea, nausea and vomiting.  Genitourinary: Negative for difficulty urinating, dysuria, flank pain, frequency and hematuria.  Musculoskeletal: Negative for arthralgias, back pain, gait problem, joint swelling and myalgias.  Skin: Negative for color change, pallor, rash and wound.  Neurological: Negative for dizziness, tremors, weakness and light-headedness.  Hematological: Negative for adenopathy. Does not bruise/bleed easily.  Psychiatric/Behavioral: Negative for agitation, behavioral problems, confusion, decreased concentration, dysphoric mood, sleep  disturbance and suicidal ideas.       Objective:   Physical Exam Constitutional:      Appearance: He is well-developed.  HENT:     Head: Normocephalic and atraumatic.  Eyes:     Extraocular Movements: Extraocular movements intact.     Conjunctiva/sclera: Conjunctivae normal.  Neck:     Vascular: No JVD.  Cardiovascular:     Rate and Rhythm: Normal rate and regular rhythm.  Pulmonary:     Effort: Pulmonary effort is normal.     Breath sounds: No wheezing.  Abdominal:     General: There is no distension.     Palpations: Abdomen is soft.  Musculoskeletal:        General: No tenderness or deformity. Normal range of motion.     Cervical back: Normal range of motion and neck supple.  Skin:    General: Skin is warm and dry.     Coloration: Skin is not pale.     Findings: No erythema or rash.  Neurological:     General: No focal deficit present.     Mental Status: He is alert and oriented to person, place, and time.  Psychiatric:        Mood and Affect: Mood normal.        Speech: Speech normal.        Behavior: Behavior normal. Behavior is not slowed.        Thought Content: Thought content normal.        Judgment: Judgment normal.           Assessment & Plan:   HIV: MDR HIV, and genotypes reviewed cumulatively. Given his extensive PI R (but no DRAM): Continue SYMTUZA TIVICAY and Bactrim  Consider Rukobia CD4 is not get above 200 I think it will   COVID prevention: Third shot today  I spent greater than 30  minutes with the patient including greater than 50% of time in face to face counsel of the patient and in coordination of his care.

## 2020-12-21 LAB — T-HELPER CELL (CD4) - (RCID CLINIC ONLY)
CD4 % Helper T Cell: 12 % — ABNORMAL LOW (ref 33–65)
CD4 T Cell Abs: 120 /uL — ABNORMAL LOW (ref 400–1790)

## 2020-12-25 LAB — CBC WITH DIFFERENTIAL/PLATELET
Absolute Monocytes: 507 cells/uL (ref 200–950)
Basophils Absolute: 41 cells/uL (ref 0–200)
Basophils Relative: 0.7 %
Eosinophils Absolute: 419 cells/uL (ref 15–500)
Eosinophils Relative: 7.1 %
HCT: 40.1 % (ref 38.5–50.0)
Hemoglobin: 13 g/dL — ABNORMAL LOW (ref 13.2–17.1)
Lymphs Abs: 1027 cells/uL (ref 850–3900)
MCH: 29.6 pg (ref 27.0–33.0)
MCHC: 32.4 g/dL (ref 32.0–36.0)
MCV: 91.3 fL (ref 80.0–100.0)
MPV: 10.4 fL (ref 7.5–12.5)
Monocytes Relative: 8.6 %
Neutro Abs: 3906 cells/uL (ref 1500–7800)
Neutrophils Relative %: 66.2 %
Platelets: 205 10*3/uL (ref 140–400)
RBC: 4.39 10*6/uL (ref 4.20–5.80)
RDW: 11.5 % (ref 11.0–15.0)
Total Lymphocyte: 17.4 %
WBC: 5.9 10*3/uL (ref 3.8–10.8)

## 2020-12-25 LAB — COMPLETE METABOLIC PANEL WITH GFR
AG Ratio: 1.4 (calc) (ref 1.0–2.5)
ALT: 22 U/L (ref 9–46)
AST: 26 U/L (ref 10–40)
Albumin: 4 g/dL (ref 3.6–5.1)
Alkaline phosphatase (APISO): 50 U/L (ref 36–130)
BUN: 20 mg/dL (ref 7–25)
CO2: 24 mmol/L (ref 20–32)
Calcium: 8.6 mg/dL (ref 8.6–10.3)
Chloride: 106 mmol/L (ref 98–110)
Creat: 0.96 mg/dL (ref 0.60–1.35)
GFR, Est African American: 111 mL/min/{1.73_m2} (ref >=60)
GFR, Est Non African American: 96 mL/min/{1.73_m2} (ref >=60)
Globulin: 2.8 g/dL (calc) (ref 1.9–3.7)
Glucose, Bld: 87 mg/dL (ref 65–99)
Potassium: 3.9 mmol/L (ref 3.5–5.3)
Sodium: 136 mmol/L (ref 135–146)
Total Bilirubin: 0.3 mg/dL (ref 0.2–1.2)
Total Protein: 6.8 g/dL (ref 6.1–8.1)

## 2020-12-25 LAB — RPR: RPR Ser Ql: NONREACTIVE

## 2020-12-25 LAB — HIV RNA, RTPCR W/R GT (RTI, PI,INT)
HIV 1 RNA Quant: 28 copies/mL — ABNORMAL HIGH
HIV-1 RNA Quant, Log: 1.45 Log copies/mL — ABNORMAL HIGH

## 2021-02-28 ENCOUNTER — Ambulatory Visit: Payer: Self-pay | Admitting: Infectious Disease

## 2021-02-28 ENCOUNTER — Telehealth: Payer: Self-pay

## 2021-02-28 NOTE — Telephone Encounter (Signed)
Left voice message asking patient to return my call to reschedule missed appointment today.     Jahnae Mcadoo Lesli Albee, CMA

## 2021-05-17 ENCOUNTER — Other Ambulatory Visit: Payer: Self-pay

## 2021-05-17 ENCOUNTER — Ambulatory Visit: Payer: Self-pay

## 2021-05-17 ENCOUNTER — Ambulatory Visit: Payer: Self-pay | Admitting: Infectious Disease

## 2021-05-17 DIAGNOSIS — B20 Human immunodeficiency virus [HIV] disease: Secondary | ICD-10-CM

## 2021-05-17 DIAGNOSIS — Z113 Encounter for screening for infections with a predominantly sexual mode of transmission: Secondary | ICD-10-CM

## 2021-05-17 DIAGNOSIS — Z79899 Other long term (current) drug therapy: Secondary | ICD-10-CM

## 2021-05-22 ENCOUNTER — Ambulatory Visit: Payer: Self-pay

## 2021-05-22 ENCOUNTER — Other Ambulatory Visit (HOSPITAL_COMMUNITY)
Admission: RE | Admit: 2021-05-22 | Discharge: 2021-05-22 | Disposition: A | Discharge status: Home / Self Care | Payer: Self-pay | Source: Ambulatory Visit | Attending: Infectious Disease | Admitting: Infectious Disease

## 2021-05-22 ENCOUNTER — Ambulatory Visit (INDEPENDENT_AMBULATORY_CARE_PROVIDER_SITE_OTHER): Payer: Self-pay

## 2021-05-22 ENCOUNTER — Other Ambulatory Visit: Payer: Self-pay

## 2021-05-22 DIAGNOSIS — Z23 Encounter for immunization: Secondary | ICD-10-CM

## 2021-05-22 DIAGNOSIS — Z113 Encounter for screening for infections with a predominantly sexual mode of transmission: Secondary | ICD-10-CM | POA: Insufficient documentation

## 2021-05-22 DIAGNOSIS — B20 Human immunodeficiency virus [HIV] disease: Secondary | ICD-10-CM | POA: Insufficient documentation

## 2021-05-22 DIAGNOSIS — Z79899 Other long term (current) drug therapy: Secondary | ICD-10-CM

## 2021-05-23 LAB — URINE CYTOLOGY ANCILLARY ONLY
Chlamydia: NEGATIVE
Comment: NEGATIVE
Comment: NORMAL
Neisseria Gonorrhea: NEGATIVE

## 2021-05-24 LAB — COMPLETE METABOLIC PANEL WITH GFR
AG Ratio: 1.3 (calc) (ref 1.0–2.5)
ALT: 9 U/L (ref 9–46)
AST: 17 U/L (ref 10–40)
Albumin: 3.9 g/dL (ref 3.6–5.1)
Alkaline phosphatase (APISO): 55 U/L (ref 36–130)
BUN: 14 mg/dL (ref 7–25)
CO2: 24 mmol/L (ref 20–32)
Calcium: 8.9 mg/dL (ref 8.6–10.3)
Chloride: 105 mmol/L (ref 98–110)
Creat: 1.01 mg/dL (ref 0.60–1.29)
Globulin: 3 g/dL (calc) (ref 1.9–3.7)
Glucose, Bld: 133 mg/dL — ABNORMAL HIGH (ref 65–99)
Potassium: 3.5 mmol/L (ref 3.5–5.3)
Sodium: 138 mmol/L (ref 135–146)
Total Bilirubin: 0.4 mg/dL (ref 0.2–1.2)
Total Protein: 6.9 g/dL (ref 6.1–8.1)
eGFR: 93 mL/min/{1.73_m2} (ref >=60)

## 2021-05-24 LAB — HELPER T-LYMPH-CD4 (ARMC ONLY)
% CD 4 Pos. Lymph.: 10.5 % — ABNORMAL LOW (ref 30.8–58.5)
Absolute CD 4 Helper: 116 /uL — ABNORMAL LOW (ref 359–1519)
Basophils Absolute: 0 10*3/uL (ref 0.0–0.2)
Basos: 1 %
EOS (ABSOLUTE): 0.3 10*3/uL (ref 0.0–0.4)
Eos: 6 %
Hematocrit: 42.1 % (ref 37.5–51.0)
Hemoglobin: 13.6 g/dL (ref 13.0–17.7)
Immature Grans (Abs): 0 10*3/uL (ref 0.0–0.1)
Immature Granulocytes: 0 %
Lymphocytes Absolute: 1.1 10*3/uL (ref 0.7–3.1)
Lymphs: 19 %
MCH: 30.1 pg (ref 26.6–33.0)
MCHC: 32.3 g/dL (ref 31.5–35.7)
MCV: 93 fL (ref 79–97)
Monocytes Absolute: 0.4 10*3/uL (ref 0.1–0.9)
Monocytes: 7 %
Neutrophils Absolute: 3.7 10*3/uL (ref 1.4–7.0)
Neutrophils: 67 %
Platelets: 203 10*3/uL (ref 150–450)
RBC: 4.52 x10E6/uL (ref 4.14–5.80)
RDW: 11.2 % — ABNORMAL LOW (ref 11.6–15.4)
WBC: 5.6 10*3/uL (ref 3.4–10.8)

## 2021-05-24 LAB — CBC WITH DIFFERENTIAL/PLATELET
Absolute Monocytes: 498 cells/uL (ref 200–950)
Basophils Absolute: 39 cells/uL (ref 0–200)
Basophils Relative: 0.7 %
Eosinophils Absolute: 370 cells/uL (ref 15–500)
Eosinophils Relative: 6.6 %
HCT: 41.4 % (ref 38.5–50.0)
Hemoglobin: 13.7 g/dL (ref 13.2–17.1)
Lymphs Abs: 1070 cells/uL (ref 850–3900)
MCH: 30.5 pg (ref 27.0–33.0)
MCHC: 33.1 g/dL (ref 32.0–36.0)
MCV: 92.2 fL (ref 80.0–100.0)
MPV: 10.5 fL (ref 7.5–12.5)
Monocytes Relative: 8.9 %
Neutro Abs: 3623 cells/uL (ref 1500–7800)
Neutrophils Relative %: 64.7 %
Platelets: 207 10*3/uL (ref 140–400)
RBC: 4.49 10*6/uL (ref 4.20–5.80)
RDW: 11 % (ref 11.0–15.0)
Total Lymphocyte: 19.1 %
WBC: 5.6 10*3/uL (ref 3.8–10.8)

## 2021-05-24 LAB — LIPID PANEL
Cholesterol: 240 mg/dL — ABNORMAL HIGH (ref <200)
HDL: 64 mg/dL (ref >=40)
LDL Cholesterol (Calc): 159 mg/dL (calc) — ABNORMAL HIGH
Non-HDL Cholesterol (Calc): 176 mg/dL (calc) — ABNORMAL HIGH (ref <130)
Total CHOL/HDL Ratio: 3.8 (calc) (ref <5.0)
Triglycerides: 73 mg/dL (ref <150)

## 2021-05-24 LAB — RPR: RPR Ser Ql: NONREACTIVE

## 2021-05-24 LAB — HIV-1 RNA QUANT-NO REFLEX-BLD
HIV 1 RNA Quant: 80 Copies/mL — ABNORMAL HIGH
HIV-1 RNA Quant, Log: 1.9 Log cps/mL — ABNORMAL HIGH

## 2021-06-05 ENCOUNTER — Ambulatory Visit: Payer: Self-pay | Admitting: Infectious Disease

## 2021-06-18 ENCOUNTER — Encounter: Payer: Self-pay | Admitting: Infectious Disease

## 2021-07-05 ENCOUNTER — Ambulatory Visit (INDEPENDENT_AMBULATORY_CARE_PROVIDER_SITE_OTHER): Payer: Self-pay | Admitting: Infectious Disease

## 2021-07-05 ENCOUNTER — Encounter: Payer: Self-pay | Admitting: Infectious Disease

## 2021-07-05 ENCOUNTER — Other Ambulatory Visit: Payer: Self-pay

## 2021-07-05 VITALS — BP 124/73 | Wt 137.6 lb

## 2021-07-05 DIAGNOSIS — Z7185 Encounter for immunization safety counseling: Secondary | ICD-10-CM

## 2021-07-05 DIAGNOSIS — B20 Human immunodeficiency virus [HIV] disease: Secondary | ICD-10-CM

## 2021-07-05 HISTORY — DX: Encounter for immunization safety counseling: Z71.85

## 2021-07-05 MED ORDER — RUKOBIA 600 MG PO TB12
600.0000 mg | ORAL_TABLET | Freq: Two times a day (BID) | ORAL | 11 refills | Status: DC
Start: 2021-07-05 — End: 2021-11-06

## 2021-07-05 MED ORDER — SULFAMETHOXAZOLE-TRIMETHOPRIM 800-160 MG PO TABS: 1.0000 | ORAL_TABLET | Freq: Every day | ORAL | 11 refills | Status: DC

## 2021-07-05 MED ORDER — TIVICAY 50 MG PO TABS
50.0000 mg | ORAL_TABLET | Freq: Every day | ORAL | 11 refills | Status: DC
Start: 2021-07-05 — End: 2021-11-06

## 2021-07-05 MED ORDER — SYMTUZA 800-150-200-10 MG PO TABS: 1.0000 | ORAL_TABLET | Freq: Every day | ORAL | 11 refills | Status: DC

## 2021-07-05 NOTE — Progress Notes (Signed)
Chief complaint: followup for HIV off medications   Subjective:  Chief complaint follow-up for HIV disease having run out of medications for 4 days  Patient ID: Juan Aguilar, male    DOB: 1976/03/15, 45 y.o.   MRN: [id number]  HPI  45 year old [name] who upon review of past HIV genotypes has EXTENSIVE NNRTI, PI and even NRTI mutations who HAD been perfectly controlled on PREZCOBIX and Truvada but had stopped taking these meds in Spring OF 2016 because he felt they were "making him feel tired." when he resumed them he was not able to tolerate them well due to vomiting them for a while but eventually was able to take them.  We decided to strengthen his regimen by adding TIVICAY to his PREZCOBIX and exchanging his Truvada to DESCOVY.   He fell out of care for 3 years.  He had failed to come in to clinic to renew HIV medication assistance program and came back with VL over 200K and CD4 abysmally low   He is now on TIVICAY and Symtuza and VL is suppressed though CD4 still low  Virus continues to be reasonably suppressed but he still has a low CD4 count.    Past Medical History:  Diagnosis Date   AIDS (HCC) 01/11/2015   HIV infection (HCC)    Noncompliance 01/11/2015   Sore throat 07/19/2020   Vaccine counseling 07/05/2021   Weight loss 07/19/2020    Past Surgical History:  Procedure Laterality Date   none      Family History  Problem Relation Age of Onset   Hypertension Father     Dad HTN   Social History   Socioeconomic History   Marital status: Married    Spouse name: Not on file   Number of children: Not on file   Years of education: Not on file   Highest education level: Not on file  Occupational History   Not on file  Tobacco Use   Smoking status: Former    Packs/day: 0.30    Types: Cigarettes    Start date: 12/21/2013   Smokeless tobacco: Never   Tobacco comments:    quit  Substance and Sexual Activity   Alcohol use: No    Alcohol/week: 0.0 standard  drinks   Drug use: No   Sexual activity: Yes    Partners: Female    Comment: pt. given condoms 08/31/20  Other Topics Concern   Not on file  Social History Narrative   Not on file   Social Determinants of Health   Financial Resource Strain: Not on file  Food Insecurity: Not on file  Transportation Needs: Not on file  Physical Activity: Not on file  Stress: Not on file  Social Connections: Not on file    No Known Allergies   Current Outpatient Medications:    fostemsavir tromethamine (RUKOBIA) 600 MG TB12 ER tablet, Take 1 tablet (600 mg total) by mouth 2 (two) times daily., Disp: 60 tablet, Rfl: 11   Darunavir-Cobicistat-Emtricitabine-Tenofovir Alafenamide (SYMTUZA) 800-150-200-10 MG TABS, Take 1 tablet by mouth daily with breakfast., Disp: 30 tablet, Rfl: 11   dolutegravir (TIVICAY) 50 MG tablet, Take 1 tablet (50 mg total) by mouth daily., Disp: 30 tablet, Rfl: 11   sulfamethoxazole-trimethoprim (BACTRIM DS) 800-160 MG tablet, Take 1 tablet by mouth daily., Disp: 30 tablet, Rfl: 11   Review of Systems  Constitutional:  Negative for activity change, appetite change, chills, diaphoresis, fatigue, fever and unexpected weight change.  HENT:  Negative for  congestion, rhinorrhea, sinus pressure, sneezing, sore throat and trouble swallowing.   Eyes:  Negative for photophobia and visual disturbance.  Respiratory:  Negative for cough, chest tightness, shortness of breath, wheezing and stridor.   Cardiovascular:  Negative for chest pain, palpitations and leg swelling.  Gastrointestinal:  Negative for abdominal distention, abdominal pain, anal bleeding, blood in stool, constipation, diarrhea, nausea and vomiting.  Genitourinary:  Negative for difficulty urinating, dysuria, flank pain and hematuria.  Musculoskeletal:  Negative for arthralgias, back pain, gait problem, joint swelling and myalgias.  Skin:  Negative for color change, pallor, rash and wound.  Neurological:  Negative for  dizziness, tremors, weakness and light-headedness.  Hematological:  Negative for adenopathy. Does not bruise/bleed easily.  Psychiatric/Behavioral:  Negative for agitation, behavioral problems, confusion, decreased concentration, dysphoric mood and sleep disturbance.       Objective:   Physical Exam Constitutional:      Appearance: He is well-developed.  HENT:     Head: Normocephalic and atraumatic.  Eyes:     Conjunctiva/sclera: Conjunctivae normal.  Cardiovascular:     Rate and Rhythm: Normal rate and regular rhythm.  Pulmonary:     Effort: Pulmonary effort is normal. No respiratory distress.     Breath sounds: No wheezing.  Abdominal:     General: There is no distension.     Palpations: Abdomen is soft.  Musculoskeletal:        General: No tenderness. Normal range of motion.     Cervical back: Normal range of motion and neck supple.  Skin:    General: Skin is warm and dry.     Coloration: Skin is not pale.     Findings: No erythema or rash.  Neurological:     General: No focal deficit present.     Mental Status: He is alert and oriented to person, place, and time.  Psychiatric:        Mood and Affect: Mood normal.        Behavior: Behavior normal.        Thought Content: Thought content normal.        Judgment: Judgment normal.          Assessment & Plan:   HIV: MDR HIV, and genotypes reviewed cumulatively. Given his extensive PI R (but no DRAM): Continue SYMTUZA TIVICAY and Bactrim HIV disease/AIDS with multidrug-resistant HIV:  I reviewed his most recent viral load which was 80 on May 23, 2019  Lab Results  Component Value Date   HIV1RNAQUANT 80 (H) 05/22/2021    Reviewed his most recent CD4 count which was 120 on same date  Lab Results  Component Value Date   CD4TABS 120 (L) 12/20/2020   CD4TABS 173 (L) 07/05/2020   CD4TABS 129 (L) 12/22/2019    I am continuing his SYMTUZA prescription TIVICAY prescription and Bactrim prescription I am adding  [place] twice daily in the hopes that that will help boost his CD4 count.   Counseling recommended that he get the updated COVID booster and influenza vaccine he did not want to get them today in clinic.

## 2021-07-26 ENCOUNTER — Ambulatory Visit: Payer: Self-pay | Admitting: Infectious Disease

## 2021-09-11 ENCOUNTER — Other Ambulatory Visit: Payer: Self-pay

## 2021-09-11 ENCOUNTER — Ambulatory Visit: Payer: Self-pay

## 2021-10-18 ENCOUNTER — Other Ambulatory Visit: Payer: Self-pay

## 2021-10-18 DIAGNOSIS — B20 Human immunodeficiency virus [HIV] disease: Secondary | ICD-10-CM

## 2021-10-18 DIAGNOSIS — Z113 Encounter for screening for infections with a predominantly sexual mode of transmission: Secondary | ICD-10-CM

## 2021-10-23 ENCOUNTER — Other Ambulatory Visit: Payer: Self-pay

## 2021-10-23 DIAGNOSIS — B20 Human immunodeficiency virus [HIV] disease: Secondary | ICD-10-CM

## 2021-11-06 ENCOUNTER — Ambulatory Visit (INDEPENDENT_AMBULATORY_CARE_PROVIDER_SITE_OTHER): Payer: Self-pay | Admitting: Infectious Disease

## 2021-11-06 ENCOUNTER — Ambulatory Visit (INDEPENDENT_AMBULATORY_CARE_PROVIDER_SITE_OTHER): Payer: Self-pay

## 2021-11-06 ENCOUNTER — Other Ambulatory Visit: Payer: Self-pay

## 2021-11-06 ENCOUNTER — Encounter: Payer: Self-pay | Admitting: Infectious Disease

## 2021-11-06 VITALS — Wt 130.0 lb

## 2021-11-06 DIAGNOSIS — Z113 Encounter for screening for infections with a predominantly sexual mode of transmission: Secondary | ICD-10-CM

## 2021-11-06 DIAGNOSIS — B20 Human immunodeficiency virus [HIV] disease: Secondary | ICD-10-CM

## 2021-11-06 DIAGNOSIS — Z23 Encounter for immunization: Secondary | ICD-10-CM

## 2021-11-06 DIAGNOSIS — Z7185 Encounter for immunization safety counseling: Secondary | ICD-10-CM

## 2021-11-06 MED ORDER — SYMTUZA 800-150-200-10 MG PO TABS: 1.0000 | ORAL_TABLET | Freq: Every day | ORAL | 11 refills | Status: DC

## 2021-11-06 MED ORDER — SULFAMETHOXAZOLE-TRIMETHOPRIM 800-160 MG PO TABS: 1.0000 | ORAL_TABLET | Freq: Every day | ORAL | 11 refills | Status: DC

## 2021-11-06 MED ORDER — TIVICAY 50 MG PO TABS: 50.0000 mg | ORAL_TABLET | Freq: Every day | ORAL | 11 refills | Status: DC

## 2021-11-06 MED ORDER — RUKOBIA 600 MG PO TB12: 600.0000 mg | ORAL_TABLET | Freq: Two times a day (BID) | ORAL | 11 refills | Status: DC

## 2021-11-06 NOTE — Progress Notes (Signed)
?Chief complaint: Follow-up for HIV disease on medications ? ?Subjective:  ?Chief complaint follow-up for HIV disease having run out of medications for 4 days ? Patient ID: Juan Aguilar, male    DOB: 1975/12/17, 46 y.o.   MRN: [id number] ? ?HPI ? ?46 year old [name] who upon review of past HIV genotypes has EXTENSIVE NNRTI, PI and even NRTI mutations who HAD been perfectly controlled on PREZCOBIX and Truvada but had stopped taking these meds in Spring OF 2016 because he felt they were "making him feel tired." when he resumed them he was not able to tolerate them well due to vomiting them for a while but eventually was able to take them. ? ?We decided to strengthen his regimen by adding TIVICAY to his PREZCOBIX and exchanging his Truvada to DESCOVY.  ? ?He fell out of care for 3 years. ? ?He had failed to come in to clinic to renew HIV medication assistance program and came back with VL over 200K and CD4 abysmally low ?  ?We switched him then to [facility] and Symtuza and VL is suppressed though CD4 still low ? ?Virus continues to be reasonably suppressed but he still has a low CD4 count. ? ?I added [place] twice daily to try to boost his T-cell count. ? ? ?Past Medical History:  ?Diagnosis Date  ? AIDS (HCC) 01/11/2015  ? HIV infection (HCC)   ? Noncompliance 01/11/2015  ? Sore throat 07/19/2020  ? Vaccine counseling 07/05/2021  ? Weight loss 07/19/2020  ? ? ?Past Surgical History:  ?Procedure Laterality Date  ? none    ? ? ?Family History  ?Problem Relation Age of Onset  ? Hypertension Father   ? ? ?Dad HTN ?  ?Social History  ? ?Socioeconomic History  ? Marital status: Married  ?  Spouse name: Not on file  ? Number of children: Not on file  ? Years of education: Not on file  ? Highest education level: Not on file  ?Occupational History  ? Not on file  ?Tobacco Use  ? Smoking status: Former  ?  Packs/day: 0.30  ?  Types: Cigarettes  ?  Start date: 12/21/2013  ? Smokeless tobacco: Never  ? Tobacco comments:  ?  quit   ?Substance and Sexual Activity  ? Alcohol use: No  ?  Alcohol/week: 0.0 standard drinks  ? Drug use: No  ? Sexual activity: Yes  ?  Partners: Female  ?  Comment: pt. given condoms 08/31/20  ?Other Topics Concern  ? Not on file  ?Social History Narrative  ? Not on file  ? ?Social Determinants of Health  ? ?Financial Resource Strain: Not on file  ?Food Insecurity: Not on file  ?Transportation Needs: Not on file  ?Physical Activity: Not on file  ?Stress: Not on file  ?Social Connections: Not on file  ? ? ?No Known Allergies ? ? ?Current Outpatient Medications:  ?  Darunavir-Cobicistat-Emtricitabine-Tenofovir Alafenamide (SYMTUZA) 800-150-200-10 MG TABS, Take 1 tablet by mouth daily with breakfast., Disp: 30 tablet, Rfl: 11 ?  dolutegravir (TIVICAY) 50 MG tablet, Take 1 tablet (50 mg total) by mouth daily., Disp: 30 tablet, Rfl: 11 ?  fostemsavir tromethamine (RUKOBIA) 600 MG TB12 ER tablet, Take 1 tablet (600 mg total) by mouth 2 (two) times daily., Disp: 60 tablet, Rfl: 11 ?  sulfamethoxazole-trimethoprim (BACTRIM DS) 800-160 MG tablet, Take 1 tablet by mouth daily., Disp: 30 tablet, Rfl: 11 ? ? ?Review of Systems  ?Constitutional:  Negative for activity change, appetite change,  chills, diaphoresis, fatigue, fever and unexpected weight change.  ?HENT:  Negative for congestion, rhinorrhea, sinus pressure, sneezing, sore throat and trouble swallowing.   ?Eyes:  Negative for photophobia and visual disturbance.  ?Respiratory:  Negative for cough, chest tightness, shortness of breath, wheezing and stridor.   ?Cardiovascular:  Negative for chest pain, palpitations and leg swelling.  ?Gastrointestinal:  Negative for abdominal distention, abdominal pain, anal bleeding, blood in stool, constipation, diarrhea, nausea and vomiting.  ?Genitourinary:  Negative for difficulty urinating, dysuria, flank pain and hematuria.  ?Musculoskeletal:  Negative for arthralgias, back pain, gait problem, joint swelling and myalgias.  ?Skin:   Negative for color change, pallor, rash and wound.  ?Neurological:  Negative for dizziness, tremors, weakness and light-headedness.  ?Hematological:  Negative for adenopathy. Does not bruise/bleed easily.  ?Psychiatric/Behavioral:  Negative for agitation, behavioral problems, confusion, decreased concentration, dysphoric mood and sleep disturbance.   ? ?   ?Objective:  ? Physical Exam ?Constitutional:   ?   Appearance: He is well-developed.  ?HENT:  ?   Head: Normocephalic and atraumatic.  ?Eyes:  ?   Conjunctiva/sclera: Conjunctivae normal.  ?Cardiovascular:  ?   Rate and Rhythm: Normal rate and regular rhythm.  ?Pulmonary:  ?   Effort: Pulmonary effort is normal. No respiratory distress.  ?   Breath sounds: No wheezing.  ?Abdominal:  ?   General: There is no distension.  ?   Palpations: Abdomen is soft.  ?Musculoskeletal:     ?   General: No tenderness. Normal range of motion.  ?   Cervical back: Normal range of motion and neck supple.  ?Skin: ?   General: Skin is warm and dry.  ?   Coloration: Skin is not pale.  ?   Findings: No erythema or rash.  ?Neurological:  ?   General: No focal deficit present.  ?   Mental Status: He is alert and oriented to person, place, and time.  ?Psychiatric:     ?   Mood and Affect: Mood normal.     ?   Behavior: Behavior normal.     ?   Thought Content: Thought content normal.     ?   Judgment: Judgment normal.  ? ? ? ? ? ?   ?Assessment & Plan:  ? ?HIV disease/AIDS MDR HIV, and genotypes reviewed cumulatively. Given his extensive PI R (but no DRAM ?I am continuing his SYMTUZA pre ? ? ?I am checking a viral load CD4 CBC and CMP ? ?I am continuing his TIVICAY his SYMTUZA and his [place] as well as Bactrim for PCP prophylaxis ? ?Vaccine counseling recommended updated bivalent COVID 19 booster which he received today. ? ? ? ? ? ? ?

## 2021-11-06 NOTE — Addendum Note (Signed)
Addended by: Lucie Leather D on: 11/06/2021 04:32 PM ? ? Modules accepted: Orders ? ?

## 2021-11-07 LAB — T-HELPER CELL (CD4) - (RCID CLINIC ONLY)
CD4 % Helper T Cell: 14 % — ABNORMAL LOW (ref 33–65)
CD4 T Cell Abs: 191 /uL — ABNORMAL LOW (ref 400–1790)

## 2021-11-08 LAB — HIV-1 RNA QUANT-NO REFLEX-BLD
HIV 1 RNA Quant: 30 Copies/mL — ABNORMAL HIGH
HIV-1 RNA Quant, Log: 1.48 Log cps/mL — ABNORMAL HIGH

## 2021-11-08 LAB — CBC WITH DIFFERENTIAL/PLATELET
Absolute Monocytes: 697 cells/uL (ref 200–950)
Basophils Absolute: 40 cells/uL (ref 0–200)
Basophils Relative: 0.4 %
Eosinophils Absolute: 152 cells/uL (ref 15–500)
Eosinophils Relative: 1.5 %
HCT: 42.2 % (ref 38.5–50.0)
Hemoglobin: 13.9 g/dL (ref 13.2–17.1)
Lymphs Abs: 1465 cells/uL (ref 850–3900)
MCH: 30.5 pg (ref 27.0–33.0)
MCHC: 32.9 g/dL (ref 32.0–36.0)
MCV: 92.5 fL (ref 80.0–100.0)
MPV: 10.2 fL (ref 7.5–12.5)
Monocytes Relative: 6.9 %
Neutro Abs: 7747 cells/uL (ref 1500–7800)
Neutrophils Relative %: 76.7 %
Platelets: 263 10*3/uL (ref 140–400)
RBC: 4.56 10*6/uL (ref 4.20–5.80)
RDW: 10.9 % — ABNORMAL LOW (ref 11.0–15.0)
Total Lymphocyte: 14.5 %
WBC: 10.1 10*3/uL (ref 3.8–10.8)

## 2021-11-08 LAB — COMPLETE METABOLIC PANEL WITH GFR
AG Ratio: 1.3 (calc) (ref 1.0–2.5)
ALT: 13 U/L (ref 9–46)
AST: 15 U/L (ref 10–40)
Albumin: 4.1 g/dL (ref 3.6–5.1)
Alkaline phosphatase (APISO): 56 U/L (ref 36–130)
BUN: 12 mg/dL (ref 7–25)
CO2: 26 mmol/L (ref 20–32)
Calcium: 9.4 mg/dL (ref 8.6–10.3)
Chloride: 104 mmol/L (ref 98–110)
Creat: 0.85 mg/dL (ref 0.60–1.29)
Globulin: 3.1 g/dL (calc) (ref 1.9–3.7)
Glucose, Bld: 69 mg/dL (ref 65–99)
Potassium: 3.8 mmol/L (ref 3.5–5.3)
Sodium: 141 mmol/L (ref 135–146)
Total Bilirubin: 0.3 mg/dL (ref 0.2–1.2)
Total Protein: 7.2 g/dL (ref 6.1–8.1)
eGFR: 109 mL/min/{1.73_m2} (ref >=60)

## 2021-11-08 LAB — RPR: RPR Ser Ql: NONREACTIVE

## 2022-01-08 ENCOUNTER — Ambulatory Visit: Payer: Self-pay | Admitting: Infectious Disease

## 2022-02-13 ENCOUNTER — Ambulatory Visit: Payer: Self-pay | Admitting: Infectious Disease

## 2022-03-26 ENCOUNTER — Ambulatory Visit: Payer: Self-pay

## 2022-03-26 ENCOUNTER — Other Ambulatory Visit: Payer: Self-pay

## 2022-04-08 ENCOUNTER — Ambulatory Visit: Payer: Self-pay | Admitting: Infectious Disease

## 2022-04-08 ENCOUNTER — Other Ambulatory Visit: Payer: Self-pay

## 2022-04-08 ENCOUNTER — Telehealth: Payer: Self-pay

## 2022-04-08 ENCOUNTER — Encounter: Payer: Self-pay | Admitting: Infectious Disease

## 2022-04-08 VITALS — BP 110/73 | HR 73 | Resp 16 | Ht 66.0 in | Wt 129.0 lb

## 2022-04-08 DIAGNOSIS — E785 Hyperlipidemia, unspecified: Secondary | ICD-10-CM

## 2022-04-08 DIAGNOSIS — B20 Human immunodeficiency virus [HIV] disease: Secondary | ICD-10-CM

## 2022-04-08 NOTE — Telephone Encounter (Signed)
Spoke to pharmacist regarding patient med dispensing. He has been only getting Tivicay since 08/2021. Pharmacist stated it is not on the pharmacy as doctor should provide patients education and patient should be liable for meds not refilled or picked up. She also stated she gives out Tivicay all the time on it's own. I did relay Dr Daiva Eves concerns that Juan Aguilar is not a med taken alone and that should be a red flag when a patient has only Tivicay to pick up. She again stated we need to better educate patents as they hold responsibility also.I advised her that the demographic does not always work to leave it all on patient.  She said doctors are supposed to label the meds stating that this RX is part 1 of 2 necessary to be taken together. Notified DR Vn Dam about this conversation with [employer].

## 2022-04-08 NOTE — Progress Notes (Signed)
Chief complaint: Follow-up for HIV disease on medications  Subjective:  Chief complaint follow-up for HIV disease having run out of medications for 4 days  Patient ID: Juan Aguilar, male    DOB: [birth year], 46 y.o.   MRN: [id number]  HPI  46 year old [name] who upon review of past HIV genotypes has EXTENSIVE NNRTI, PI and even NRTI mutations who HAD been perfectly controlled on PREZCOBIX and Truvada but had stopped taking these meds in Spring OF 2016 because he felt they were "making him feel tired." when he resumed them he was not able to tolerate them well due to vomiting them for a while but eventually was able to take them.  We decided to strengthen his regimen by adding TIVICAY to his PREZCOBIX and exchanging his Truvada to DESCOVY.   He fell out of care for 3 years.  He had failed to come in to clinic to renew HIV medication assistance program and came back with VL over 200K and CD4 abysmally low   We switched him then to [facility] and Symtuza and VL is suppressed though CD4 still low  Virus continues to be reasonably suppressed but he still has a low CD4 count.  I added [place] twice daily to try to boost his T-cell count.  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 Symtuza was not given nor was [place].  I am worried he has INSTI R.  Note [facility] on [address] were called and said that they rx "Tivicay by itself all of the time"--which I certainly hope they do not.    Past Medical History:  Diagnosis Date   AIDS (HCC) 01/11/2015   HIV infection (HCC)    Noncompliance 01/11/2015   Sore throat 07/19/2020   Vaccine counseling 07/05/2021   Weight loss 07/19/2020    Past Surgical History:  Procedure Laterality Date   none      Family History  Problem Relation Age of Onset   Hypertension Father     Dad HTN   Social History   Socioeconomic History   Marital status: Married     Spouse name: Not on file   Number of children: Not on file   Years of education: Not on file   Highest education level: Not on file  Occupational History   Not on file  Tobacco Use   Smoking status: Former    Packs/day: 0.30    Types: Cigarettes    Start date: 12/21/2013   Smokeless tobacco: Never   Tobacco comments:    quit  Substance and Sexual Activity   Alcohol use: No    Alcohol/week: 0.0 standard drinks of alcohol   Drug use: No   Sexual activity: Yes    Partners: Female    Comment: pt. given condoms 08/31/20  Other Topics Concern   Not on file  Social History Narrative   Not on file   Social Determinants of Health   Financial Resource Strain: Not on file  Food Insecurity: Not on file  Transportation Needs: Not on file  Physical Activity: Not on file  Stress: Not on file  Social Connections: Not on file    No Known Allergies   Current Outpatient Medications:    Darunavir-Cobicistat-Emtricitabine-Tenofovir Alafenamide (SYMTUZA) 800-150-200-10 MG TABS, Take 1 tablet by mouth daily with breakfast., Disp: 30 tablet, Rfl: 11   dolutegravir (TIVICAY) 50 MG tablet, Take 1 tablet (50 mg total) by mouth daily., Disp:  30 tablet, Rfl: 11   sulfamethoxazole-trimethoprim (BACTRIM DS) 800-160 MG tablet, Take 1 tablet by mouth daily., Disp: 30 tablet, Rfl: 11   Review of Systems  Constitutional:  Negative for activity change, appetite change, chills, diaphoresis, fatigue, fever and unexpected weight change.  HENT:  Negative for congestion, rhinorrhea, sinus pressure, sneezing, sore throat and trouble swallowing.   Eyes:  Negative for photophobia and visual disturbance.  Respiratory:  Negative for cough, chest tightness, shortness of breath, wheezing and stridor.   Cardiovascular:  Negative for chest pain, palpitations and leg swelling.  Gastrointestinal:  Negative for abdominal distention, abdominal pain, anal bleeding, blood in stool, constipation, diarrhea, nausea and  vomiting.  Genitourinary:  Negative for difficulty urinating, dysuria, flank pain and hematuria.  Musculoskeletal:  Negative for arthralgias, back pain, gait problem, joint swelling and myalgias.  Skin:  Negative for color change, pallor, rash and wound.  Neurological:  Negative for dizziness, tremors, weakness and light-headedness.  Hematological:  Negative for adenopathy. Does not bruise/bleed easily.  Psychiatric/Behavioral:  Negative for agitation, behavioral problems, confusion, decreased concentration, dysphoric mood and sleep disturbance.        Objective:   Physical Exam Constitutional:      Appearance: He is well-developed.  HENT:     Head: Normocephalic and atraumatic.  Eyes:     Conjunctiva/sclera: Conjunctivae normal.  Cardiovascular:     Rate and Rhythm: Normal rate and regular rhythm.  Pulmonary:     Effort: Pulmonary effort is normal. No respiratory distress.     Breath sounds: No wheezing.  Abdominal:     General: There is no distension.     Palpations: Abdomen is soft.  Musculoskeletal:        General: No tenderness. Normal range of motion.     Cervical back: Normal range of motion and neck supple.  Skin:    General: Skin is warm and dry.     Coloration: Skin is not pale.     Findings: No erythema or rash.  Neurological:     General: No focal deficit present.     Mental Status: He is alert and oriented to person, place, and time.  Psychiatric:        Mood and Affect: Mood normal.        Thought Content: Thought content normal.        Judgment: Judgment normal.           Assessment & Plan:   HIV disease:  I am having him stop all of his medications and we are checking an HIV RNA with reflex to genotypen including INSTI R  Perhaps he should just go back to being on Symtuza alone--and that may be one of our only options orally if he has failed INSTI monotherapy  Check a CD4 CBC and CMP as well  We will continue Bactrim for PCP prevention  CV  prevention: would like to start a statin as well but     I spent 41 minutes with the patient including than 50% of the time in face to face counseling of the patient and the need to be on a complete regimen, along with review of medical records in preparation for the visit and during the visit and in coordination of his care.

## 2022-04-08 NOTE — Patient Instructions (Signed)
We are going you to need to STOP all of your medications except for the bactrim (the white antibiotic)  We will get labs today and I will come up with a new regimen at your visit

## 2022-04-09 LAB — T-HELPER CELLS (CD4) COUNT (NOT AT ARMC)
CD4 % Helper T Cell: 12 % — ABNORMAL LOW (ref 33–65)
CD4 T Cell Abs: 84 /uL — ABNORMAL LOW (ref 400–1790)

## 2022-04-16 ENCOUNTER — Telehealth: Payer: Self-pay

## 2022-04-16 LAB — RPR: RPR Ser Ql: NONREACTIVE

## 2022-04-16 LAB — COMPLETE METABOLIC PANEL WITH GFR
AG Ratio: 1.2 (calc) (ref 1.0–2.5)
ALT: 35 U/L (ref 9–46)
AST: 31 U/L (ref 10–40)
Albumin: 4.2 g/dL (ref 3.6–5.1)
Alkaline phosphatase (APISO): 49 U/L (ref 36–130)
BUN: 9 mg/dL (ref 7–25)
CO2: 26 mmol/L (ref 20–32)
Calcium: 9.2 mg/dL (ref 8.6–10.3)
Chloride: 102 mmol/L (ref 98–110)
Creat: 0.64 mg/dL (ref 0.60–1.29)
Globulin: 3.4 g/dL (calc) (ref 1.9–3.7)
Glucose, Bld: 78 mg/dL (ref 65–99)
Potassium: 3.3 mmol/L — ABNORMAL LOW (ref 3.5–5.3)
Sodium: 138 mmol/L (ref 135–146)
Total Bilirubin: 0.8 mg/dL (ref 0.2–1.2)
Total Protein: 7.6 g/dL (ref 6.1–8.1)
eGFR: 119 mL/min/{1.73_m2} (ref >=60)

## 2022-04-16 LAB — CBC WITH DIFFERENTIAL/PLATELET
Absolute Monocytes: 532 cells/uL (ref 200–950)
Basophils Absolute: 20 cells/uL (ref 0–200)
Basophils Relative: 0.5 %
Eosinophils Absolute: 192 cells/uL (ref 15–500)
Eosinophils Relative: 4.8 %
HCT: 42.9 % (ref 38.5–50.0)
Hemoglobin: 14.2 g/dL (ref 13.2–17.1)
Lymphs Abs: 760 cells/uL — ABNORMAL LOW (ref 850–3900)
MCH: 30.3 pg (ref 27.0–33.0)
MCHC: 33.1 g/dL (ref 32.0–36.0)
MCV: 91.5 fL (ref 80.0–100.0)
MPV: 10.3 fL (ref 7.5–12.5)
Monocytes Relative: 13.3 %
Neutro Abs: 2496 cells/uL (ref 1500–7800)
Neutrophils Relative %: 62.4 %
Platelets: 258 10*3/uL (ref 140–400)
RBC: 4.69 10*6/uL (ref 4.20–5.80)
RDW: 11.5 % (ref 11.0–15.0)
Total Lymphocyte: 19 %
WBC: 4 10*3/uL (ref 3.8–10.8)

## 2022-04-16 LAB — LIPID PANEL
Cholesterol: 198 mg/dL (ref <200)
HDL: 65 mg/dL (ref >=40)
LDL Cholesterol (Calc): 115 mg/dL (calc) — ABNORMAL HIGH
Non-HDL Cholesterol (Calc): 133 mg/dL (calc) — ABNORMAL HIGH (ref <130)
Total CHOL/HDL Ratio: 3 (calc) (ref <5.0)
Triglycerides: 81 mg/dL (ref <150)

## 2022-04-16 LAB — HIV-1 INTEGRASE GENOTYPE

## 2022-04-16 LAB — HIV RNA, RTPCR W/R GT (RTI, PI,INT)
HIV 1 RNA Quant: 108000 copies/mL — ABNORMAL HIGH
HIV-1 RNA Quant, Log: 5.03 Log copies/mL — ABNORMAL HIGH

## 2022-04-16 LAB — HIV-1 GENOTYPE: HIV-1 Genotype: DETECTED — AB

## 2022-04-16 NOTE — Telephone Encounter (Signed)
-----   Message from Randall Hiss, MD sent at 04/16/2022  1:40 PM EDT ----- Can we get Kwame in to see pharmacy and restart him on a regimen he may be able to just be on symtuza from ARV perspective. Fortunately the DTG monotherapy didn't result in resistance that we know of ----- Message ----- From: Interface, [facility] Lab Results In Sent: 04/09/2022   1:58 AM EDT To: Randall Hiss, MD

## 2022-04-30 ENCOUNTER — Ambulatory Visit (INDEPENDENT_AMBULATORY_CARE_PROVIDER_SITE_OTHER): Payer: Self-pay | Admitting: Infectious Disease

## 2022-04-30 ENCOUNTER — Other Ambulatory Visit: Payer: Self-pay

## 2022-04-30 VITALS — BP 112/70 | HR 71 | Temp 98.2°F | Wt 126.0 lb

## 2022-04-30 DIAGNOSIS — B20 Human immunodeficiency virus [HIV] disease: Secondary | ICD-10-CM

## 2022-04-30 DIAGNOSIS — Z91199 Patient's noncompliance with other medical treatment and regimen due to unspecified reason: Secondary | ICD-10-CM

## 2022-04-30 DIAGNOSIS — Z7185 Encounter for immunization safety counseling: Secondary | ICD-10-CM

## 2022-04-30 DIAGNOSIS — E782 Mixed hyperlipidemia: Secondary | ICD-10-CM

## 2022-04-30 MED ORDER — SYMTUZA 800-150-200-10 MG PO TABS: 1.0000 | ORAL_TABLET | Freq: Every day | ORAL | 5 refills | Status: DC

## 2022-04-30 MED ORDER — SULFAMETHOXAZOLE-TRIMETHOPRIM 800-160 MG PO TABS: 1.0000 | ORAL_TABLET | Freq: Every day | ORAL | 11 refills | Status: DC

## 2022-04-30 NOTE — Progress Notes (Signed)
Chief complaint: Follow-up for HIV not on medications.  Subjective:  Chief complaint follow-up for HIV disease having run out of medications for 4 days  Patient ID: Juan Aguilar, male    DOB: 1975-11-02, 46 y.o.   MRN: [id number]  HPI  74 ear old [name] who upon review of past HIV genotypes has EXTENSIVE NNRTI, PI and even NRTI mutations who HAD been perfectly controlled on PREZCOBIX and Truvada but had stopped taking these meds in Spring OF 2016 because he felt they were "making him feel tired." when he resumed them he was not able to tolerate them well due to vomiting them for a while but eventually was able to take them.  We decided to strengthen his regimen by adding TIVICAY to his PREZCOBIX and exchanging his Truvada to DESCOVY.   He fell out of care for 3 years.  He had failed to come in to clinic to renew HIV medication assistance program and came back with VL over 200K and CD4 abysmally low   We switched him then to [facility] and Symtuza and VL is suppressed though CD4 still low  Virus continues to be reasonably suppressed but he still has a low CD4 count.  I added Juan Aguilar twice daily to try to boost his T-cell count.  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 Symtuza was not given nor was Juan Aguilar.  I am worried he has INSTI R.  Note [facility] on [address] were called and said that they rx "Tivicay by itself all of the time"--which I certainly hope they do not.  We had him stop all of his antivirals we have sent off a viral load reflex for genotype and fortunately this showed no evidence of resistance to integrase or anything else on this genotypes only wild-type virus was circulating we brought her back to clinic today and I am going to place him on SYMTUZA alone.  He has sustained some burns on his arms from his work at the [employer] where he works though they are healing  up.    Past Medical History:  Diagnosis Date   AIDS (HCC) 01/11/2015   HIV infection (HCC)    Noncompliance 01/11/2015   Sore throat 07/19/2020   Vaccine counseling 07/05/2021   Weight loss 07/19/2020    Past Surgical History:  Procedure Laterality Date   none      Family History  Problem Relation Age of Onset   Hypertension Father     Dad HTN   Social History   Socioeconomic History   Marital status: Married    Spouse name: Not on file   Number of children: Not on file   Years of education: Not on file   Highest education level: Not on file  Occupational History   Not on file  Tobacco Use   Smoking status: Former    Packs/day: 0.30    Types: Cigarettes    Start date: 12/21/2013   Smokeless tobacco: Never   Tobacco comments:    quit  Substance and Sexual Activity   Alcohol use: No    Alcohol/week: 0.0 standard drinks of alcohol   Drug use: No   Sexual activity: Yes    Partners: Female    Comment: pt. given condoms 08/31/20  Other Topics Concern   Not on file  Social History Narrative   Not on file   Social Determinants of Health   Financial Resource Strain: Not  on file  Food Insecurity: Not on file  Transportation Needs: Not on file  Physical Activity: Not on file  Stress: Not on file  Social Connections: Not on file    No Known Allergies   Current Outpatient Medications:    Darunavir-Cobicistat-Emtricitabine-Tenofovir Alafenamide (SYMTUZA) 800-150-200-10 MG TABS, Take 1 tablet by mouth daily with breakfast., Disp: 30 tablet, Rfl: 5   sulfamethoxazole-trimethoprim (BACTRIM DS) 800-160 MG tablet, Take 1 tablet by mouth daily., Disp: 30 tablet, Rfl: 11   Review of Systems  Constitutional:  Negative for activity change, appetite change, chills, diaphoresis, fatigue, fever and unexpected weight change.  HENT:  Negative for congestion, rhinorrhea, sinus pressure, sneezing, sore throat and trouble swallowing.   Eyes:  Negative for photophobia and visual  disturbance.  Respiratory:  Negative for cough, chest tightness, shortness of breath, wheezing and stridor.   Cardiovascular:  Negative for chest pain, palpitations and leg swelling.  Gastrointestinal:  Negative for abdominal distention, abdominal pain, anal bleeding, blood in stool, constipation, diarrhea, nausea and vomiting.  Genitourinary:  Negative for difficulty urinating, dysuria, flank pain and hematuria.  Musculoskeletal:  Negative for arthralgias, back pain, gait problem, joint swelling and myalgias.  Skin:  Positive for wound. Negative for color change, pallor and rash.  Neurological:  Negative for dizziness, tremors, weakness and light-headedness.  Hematological:  Negative for adenopathy. Does not bruise/bleed easily.  Psychiatric/Behavioral:  Negative for agitation, behavioral problems, confusion, decreased concentration, dysphoric mood and sleep disturbance.        Objective:   Physical Exam Constitutional:      Appearance: He is well-developed.  HENT:     Head: Normocephalic and atraumatic.  Eyes:     Conjunctiva/sclera: Conjunctivae normal.  Cardiovascular:     Rate and Rhythm: Normal rate and regular rhythm.  Pulmonary:     Effort: Pulmonary effort is normal. No respiratory distress.     Breath sounds: No wheezing.  Abdominal:     General: There is no distension.     Palpations: Abdomen is soft.  Musculoskeletal:        General: No tenderness. Normal range of motion.     Cervical back: Normal range of motion and neck supple.  Skin:    General: Skin is warm and dry.     Coloration: Skin is not pale.     Findings: No erythema or rash.  Neurological:     General: No focal deficit present.     Mental Status: He is alert and oriented to person, place, and time.  Psychiatric:        Mood and Affect: Mood normal.        Behavior: Behavior normal.        Thought Content: Thought content normal.        Judgment: Judgment normal.    He has some healing scabs on  bilateral forearms       Assessment & Plan:   AIDS HIV disease with multidrug resistant virus:  Given what happened with him being given a partial regimen of TIVICAY I will place him back on SYMTUZA which has to active drugs against his HIV with darunavir and tenofovir  He will continue on Bactrim for PCP prophylaxis.  Cardiovascular prevention he should continue on [place] statin  Vaccine counseling and need for the COVID-19 vaccine and flu shot available.  We will bring him back in 1 month's time to recheck his labs

## 2022-05-02 ENCOUNTER — Other Ambulatory Visit: Payer: Self-pay | Admitting: Pharmacist

## 2022-05-02 DIAGNOSIS — B20 Human immunodeficiency virus [HIV] disease: Secondary | ICD-10-CM

## 2022-05-02 MED ORDER — SYMTUZA 800-150-200-10 MG PO TABS: 1.0000 | ORAL_TABLET | Freq: Every day | ORAL | 0 refills | Status: AC

## 2022-05-02 NOTE — Progress Notes (Signed)
Medication Samples have been provided to the patient.  Drug name: Symtuza        Strength: 800/150/200/10 mg Qty: 1 bottle (30 tablets) LOT: [id number]   Exp.Date: 11/2023  Dosing instructions: Take one tablet by mouth once daily with food  The patient has been instructed regarding the correct time, dose, and frequency of taking this medication, including desired effects and most common side effects.   Alpha Mysliwiec L. Jannette Fogo, PharmD, BCIDP, AAHIVP, CPP Clinical Pharmacist Practitioner Infectious Diseases Clinical Pharmacist [facility] 07/31/2020, 10:07 AM

## 2022-06-04 ENCOUNTER — Encounter: Payer: Self-pay | Admitting: Infectious Disease

## 2022-06-04 ENCOUNTER — Other Ambulatory Visit: Payer: Self-pay

## 2022-06-04 ENCOUNTER — Ambulatory Visit (INDEPENDENT_AMBULATORY_CARE_PROVIDER_SITE_OTHER): Payer: Self-pay | Admitting: Infectious Disease

## 2022-06-04 VITALS — BP 119/77 | HR 70 | Temp 98.0°F | Ht 64.0 in | Wt 136.0 lb

## 2022-06-04 DIAGNOSIS — E785 Hyperlipidemia, unspecified: Secondary | ICD-10-CM | POA: Insufficient documentation

## 2022-06-04 DIAGNOSIS — E782 Mixed hyperlipidemia: Secondary | ICD-10-CM

## 2022-06-04 DIAGNOSIS — B20 Human immunodeficiency virus [HIV] disease: Secondary | ICD-10-CM

## 2022-06-04 DIAGNOSIS — Z7185 Encounter for immunization safety counseling: Secondary | ICD-10-CM

## 2022-06-04 DIAGNOSIS — Z23 Encounter for immunization: Secondary | ICD-10-CM

## 2022-06-04 MED ORDER — PITAVASTATIN MAGNESIUM 4 MG PO TABS: 4.0000 mg | ORAL_TABLET | Freq: Every day | ORAL | 11 refills | Status: DC

## 2022-06-04 MED ORDER — SYMTUZA 800-150-200-10 MG PO TABS: 1.0000 | ORAL_TABLET | Freq: Every day | ORAL | 5 refills | Status: DC

## 2022-06-04 MED ORDER — SULFAMETHOXAZOLE-TRIMETHOPRIM 800-160 MG PO TABS: 1.0000 | ORAL_TABLET | Freq: Every day | ORAL | 11 refills | Status: DC

## 2022-06-04 NOTE — Progress Notes (Signed)
Chief complaint: Follow-up for HIV disease on medications again Subjective:    Patient ID: Juan Aguilar, male    DOB: 1976/08/03, 46 y.o.   MRN: [id number]  HPI  66 ear old [name] who upon review of past HIV genotypes has EXTENSIVE NNRTI, PI and even NRTI mutations who HAD been perfectly controlled on PREZCOBIX and Truvada but had stopped taking these meds in Spring OF 2016 because he felt they were "making him feel tired." when he resumed them he was not able to tolerate them well due to vomiting them for a while but eventually was able to take them.  We decided to strengthen his regimen by adding TIVICAY to his PREZCOBIX and exchanging his Truvada to DESCOVY.   He fell out of care for 3 years.  He had failed to come in to clinic to renew HIV medication assistance program and came back with VL over 200K and CD4 abysmally low   We switched him then to [place] and VL is suppressed though CD4 still low  Virus continued to be reasonably suppressed but he still has a low CD4 count.  I added [place] twice daily to try to boost his T-cell count.  Unfortunately since I last saw him Council has been getting Tivicay ALONE monotherapy for his multidrug resistant HIV since February. Symtuza was not given nor was [place].  I am worried he has INSTI R.  Note [facility] on [address] were called and said that they rx "Tivicay by itself all of the time"--which I certainly hope they do not.  We had him stop all of his antivirals we have sent off a viral load reflex for genotype and fortunately this showed no evidence of resistance to integrase or anything else on this genotypes only wild-type virus was circulating we brought her back to clinic today and I placed him on [place] alone.  He claims to be adherent to the [facility] as well as the Bactrim.  He has brought paystub's with him again today.     Past Medical History:  Diagnosis Date   AIDS ([employer]) 01/11/2015   HIV infection ([place])     Hyperlipidemia 06/04/2022   Noncompliance 01/11/2015   Sore throat 07/19/2020   Vaccine counseling 07/05/2021   Weight loss 07/19/2020    Past Surgical History:  Procedure Laterality Date   none      Family History  Problem Relation Age of Onset   Hypertension Father     Dad HTN   Social History   Socioeconomic History   Marital status: Married    Spouse name: Not on file   Number of children: Not on file   Years of education: Not on file   Highest education level: Not on file  Occupational History   Not on file  Tobacco Use   Smoking status: Former    Packs/day: 0.30    Types: Cigarettes    Start date: 12/21/2013   Smokeless tobacco: Never   Tobacco comments:    quit  Substance and Sexual Activity   Alcohol use: No    Alcohol/week: 0.0 standard drinks of alcohol   Drug use: No   Sexual activity: Yes    Partners: Female    Comment: pt. given condoms 08/31/20  Other Topics Concern   Not on file  Social History Narrative   Not on file   Social Determinants of Health   Financial Resource Strain: Not on file  Food Insecurity: Not on file  Transportation Needs: Not  on file  Physical Activity: Not on file  Stress: Not on file  Social Connections: Not on file    No Known Allergies   Current Outpatient Medications:    Pitavastatin Magnesium 4 MG TABS, Take 4 mg by mouth daily., Disp: 30 tablet, Rfl: 11   Darunavir-Cobicistat-Emtricitabine-Tenofovir Alafenamide (SYMTUZA) 800-150-200-10 MG TABS, Take 1 tablet by mouth daily with breakfast., Disp: 30 tablet, Rfl: 5   sulfamethoxazole-trimethoprim (BACTRIM DS) 800-160 MG tablet, Take 1 tablet by mouth daily., Disp: 30 tablet, Rfl: 11   Review of Systems  Constitutional:  Negative for activity change, appetite change, chills, diaphoresis, fatigue, fever and unexpected weight change.  HENT:  Negative for congestion, rhinorrhea, sinus pressure, sneezing, sore throat and trouble swallowing.   Eyes:  Negative for  photophobia and visual disturbance.  Respiratory:  Negative for cough, chest tightness, shortness of breath, wheezing and stridor.   Cardiovascular:  Negative for chest pain, palpitations and leg swelling.  Gastrointestinal:  Negative for abdominal distention, abdominal pain, anal bleeding, blood in stool, constipation, diarrhea, nausea and vomiting.  Genitourinary:  Negative for difficulty urinating, dysuria, flank pain and hematuria.  Musculoskeletal:  Negative for arthralgias, back pain, gait problem, joint swelling and myalgias.  Skin:  Negative for color change, pallor, rash and wound.  Neurological:  Negative for dizziness, tremors, weakness and light-headedness.  Hematological:  Negative for adenopathy. Does not bruise/bleed easily.  Psychiatric/Behavioral:  Negative for agitation, behavioral problems, confusion, decreased concentration, dysphoric mood and sleep disturbance.        Objective:   Physical Exam Constitutional:      Appearance: He is well-developed.  HENT:     Head: Normocephalic and atraumatic.  Eyes:     Conjunctiva/sclera: Conjunctivae normal.  Cardiovascular:     Rate and Rhythm: Normal rate and regular rhythm.  Pulmonary:     Effort: Pulmonary effort is normal. No respiratory distress.     Breath sounds: No wheezing.  Abdominal:     General: There is no distension.     Palpations: Abdomen is soft.  Musculoskeletal:        General: No tenderness. Normal range of motion.     Cervical back: Normal range of motion and neck supple.  Skin:    General: Skin is warm and dry.     Coloration: Skin is not pale.     Findings: No erythema or rash.  Neurological:     General: No focal deficit present.     Mental Status: He is alert and oriented to person, place, and time.  Psychiatric:        Mood and Affect: Mood normal.        Behavior: Behavior normal.        Thought Content: Thought content normal.        Judgment: Judgment normal.    He has some healing  scabs on bilateral forearms       Assessment & Plan:  AIDS:  I will add order HIV viral load CD4 count CBC with differential CMP, RPR GC and chlamydia and I will continue  Halvor Runyon's  [facility] prescription   He will continue on Bactrim for PCP prophylaxis. Lipidemia and cardiovascular prevention we will initiate pitavastatin  Vaccine counseling recommended flu shot and updated COVID booster which he received

## 2022-06-05 LAB — T-HELPER CELLS (CD4) COUNT (NOT AT ARMC)
CD4 % Helper T Cell: 13 % — ABNORMAL LOW (ref 33–65)
CD4 T Cell Abs: 122 /uL — ABNORMAL LOW (ref 400–1790)

## 2022-06-05 LAB — URINE CYTOLOGY ANCILLARY ONLY
Chlamydia: NEGATIVE
Comment: NEGATIVE
Comment: NORMAL
Neisseria Gonorrhea: NEGATIVE

## 2022-06-07 LAB — COMPLETE METABOLIC PANEL WITH GFR
AG Ratio: 1.3 (calc) (ref 1.0–2.5)
ALT: 17 U/L (ref 9–46)
AST: 16 U/L (ref 10–40)
Albumin: 4.7 g/dL (ref 3.6–5.1)
Alkaline phosphatase (APISO): 53 U/L (ref 36–130)
BUN: 17 mg/dL (ref 7–25)
CO2: 28 mmol/L (ref 20–32)
Calcium: 9.4 mg/dL (ref 8.6–10.3)
Chloride: 100 mmol/L (ref 98–110)
Creat: 0.93 mg/dL (ref 0.60–1.29)
Globulin: 3.5 g/dL (calc) (ref 1.9–3.7)
Glucose, Bld: 88 mg/dL (ref 65–99)
Potassium: 3.7 mmol/L (ref 3.5–5.3)
Sodium: 135 mmol/L (ref 135–146)
Total Bilirubin: 0.5 mg/dL (ref 0.2–1.2)
Total Protein: 8.2 g/dL — ABNORMAL HIGH (ref 6.1–8.1)
eGFR: 103 mL/min/{1.73_m2} (ref >=60)

## 2022-06-07 LAB — CBC WITH DIFFERENTIAL/PLATELET
Absolute Monocytes: 640 cells/uL (ref 200–950)
Basophils Absolute: 41 cells/uL (ref 0–200)
Basophils Relative: 0.5 %
Eosinophils Absolute: 178 cells/uL (ref 15–500)
Eosinophils Relative: 2.2 %
HCT: 44.9 % (ref 38.5–50.0)
Hemoglobin: 15.1 g/dL (ref 13.2–17.1)
Lymphs Abs: 1013 cells/uL (ref 850–3900)
MCH: 30.9 pg (ref 27.0–33.0)
MCHC: 33.6 g/dL (ref 32.0–36.0)
MCV: 91.8 fL (ref 80.0–100.0)
MPV: 10.3 fL (ref 7.5–12.5)
Monocytes Relative: 7.9 %
Neutro Abs: 6229 cells/uL (ref 1500–7800)
Neutrophils Relative %: 76.9 %
Platelets: 239 10*3/uL (ref 140–400)
RBC: 4.89 10*6/uL (ref 4.20–5.80)
RDW: 11.7 % (ref 11.0–15.0)
Total Lymphocyte: 12.5 %
WBC: 8.1 10*3/uL (ref 3.8–10.8)

## 2022-06-07 LAB — HIV-1 RNA QUANT-NO REFLEX-BLD
HIV 1 RNA Quant: 1020 Copies/mL — ABNORMAL HIGH
HIV-1 RNA Quant, Log: 3.01 Log cps/mL — ABNORMAL HIGH

## 2022-06-07 LAB — LIPID PANEL
Cholesterol: 248 mg/dL — ABNORMAL HIGH (ref <200)
HDL: 105 mg/dL (ref >=40)
LDL Cholesterol (Calc): 129 mg/dL (calc) — ABNORMAL HIGH
Non-HDL Cholesterol (Calc): 143 mg/dL (calc) — ABNORMAL HIGH (ref <130)
Total CHOL/HDL Ratio: 2.4 (calc) (ref <5.0)
Triglycerides: 50 mg/dL (ref <150)

## 2022-06-07 LAB — RPR: RPR Ser Ql: NONREACTIVE

## 2022-09-09 ENCOUNTER — Ambulatory Visit: Payer: Self-pay | Admitting: Infectious Disease

## 2023-05-05 ENCOUNTER — Telehealth: Payer: Self-pay

## 2023-05-05 NOTE — Telephone Encounter (Signed)
Called [facility] to schedule overdue follow up appointment. One number is out of service and the other does not belong to [facility].   [facility] Referral Notice  Attempts to reach patient to re-engage in ID clinic care have not been successful. Patient is considered out of care from the ID clinic. Patient referred to [facility] to confirm or assist in engagement in HIV care.   [facility] Notes: out of care  Last HIV Viral Load:  HIV 1 RNA Quant  Date Value Ref Range Status  06/04/2022 1,020 (H) Copies/mL Final    Last [facility] Visit: 06/04/22  Duration of Service: 5 minutes  Sandie Ano, RN

## 2023-10-14 ENCOUNTER — Other Ambulatory Visit (HOSPITAL_COMMUNITY): Payer: Self-pay

## 2023-11-04 ENCOUNTER — Ambulatory Visit: Payer: Self-pay | Admitting: Infectious Disease

## 2023-11-04 ENCOUNTER — Encounter: Payer: Self-pay | Admitting: Infectious Disease

## 2023-11-04 ENCOUNTER — Other Ambulatory Visit: Payer: Self-pay

## 2023-11-04 VITALS — BP 124/75 | HR 68 | Temp 98.4°F

## 2023-11-04 DIAGNOSIS — E785 Hyperlipidemia, unspecified: Secondary | ICD-10-CM

## 2023-11-04 DIAGNOSIS — E782 Mixed hyperlipidemia: Secondary | ICD-10-CM

## 2023-11-04 DIAGNOSIS — Z23 Encounter for immunization: Secondary | ICD-10-CM

## 2023-11-04 DIAGNOSIS — Z91199 Patient's noncompliance with other medical treatment and regimen due to unspecified reason: Secondary | ICD-10-CM

## 2023-11-04 DIAGNOSIS — Z7185 Encounter for immunization safety counseling: Secondary | ICD-10-CM

## 2023-11-04 DIAGNOSIS — B20 Human immunodeficiency virus [HIV] disease: Secondary | ICD-10-CM

## 2023-11-04 MED ORDER — ATORVASTATIN CALCIUM 20 MG PO TABS: 20.0000 mg | ORAL_TABLET | Freq: Every day | ORAL | 11 refills | Status: DC

## 2023-11-04 MED ORDER — SULFAMETHOXAZOLE-TRIMETHOPRIM 800-160 MG PO TABS: 1.0000 | ORAL_TABLET | Freq: Every day | ORAL | 11 refills | Status: DC

## 2023-11-04 MED ORDER — SYMTUZA 800-150-200-10 MG PO TABS: 1.0000 | ORAL_TABLET | Freq: Every day | ORAL | 11 refills | Status: DC

## 2023-11-04 NOTE — Progress Notes (Signed)
 Subjective:  Chief complaint: follow-up for HIV disease on medications   Patient ID: Juan Aguilar, male    DOB: [birth year], 48 y.o.   MRN: [id number]  HPI  Discussed the use of AI scribe software for clinical note transcription with the patient, who gave verbal consent to proceed.  History of Present Illness   The patient, with a history of HIV, presents for a medication refill. He reports running out of his medications, Symtuza and Bactrim, and was unable to refill them at the pharmacy due to being on an assistance program in 2023!!! .    The patient's CD4 count was low at his last visit and is expected to be low again due to medication non-adherence. He was previously on [place] twice a day in addition to Symtuza once a day to help increase his CD4 count.    The patient also has high cholesterol and was advised to start Lipitor to reduce inflammation in his body.      Past Medical History:  Diagnosis Date   AIDS (HCC) 01/11/2015   HIV infection (HCC)    Hyperlipidemia 06/04/2022   Noncompliance 01/11/2015   Sore throat 07/19/2020   Vaccine counseling 07/05/2021   Weight loss 07/19/2020    Past Surgical History:  Procedure Laterality Date   none      Family History  Problem Relation Age of Onset   Hypertension Father       Social History   Socioeconomic History   Marital status: Married    Spouse name: Not on file   Number of children: Not on file   Years of education: Not on file   Highest education level: Not on file  Occupational History   Not on file  Tobacco Use   Smoking status: Former    Current packs/day: 0.30    Average packs/day: 0.3 packs/day for 9.9 years (3.0 ttl pk-yrs)    Types: Cigarettes    Start date: 12/21/2013   Smokeless tobacco: Never   Tobacco comments:    quit  Substance and Sexual Activity   Alcohol use: No    Alcohol/week: 0.0 standard drinks of alcohol   Drug use: No   Sexual activity: Yes    Partners: Female    Comment: pt.  given condoms 08/31/20  Other Topics Concern   Not on file  Social History Narrative   Not on file   Social Drivers of Health   Financial Resource Strain: Not on file  Food Insecurity: Not on file  Transportation Needs: Not on file  Physical Activity: Not on file  Stress: Not on file  Social Connections: Not on file    No Known Allergies   Current Outpatient Medications:    Darunavir-Cobicistat-Emtricitabine-Tenofovir Alafenamide (SYMTUZA) 800-150-200-10 MG TABS, Take 1 tablet by mouth daily with breakfast., Disp: 30 tablet, Rfl: 5   Pitavastatin Magnesium 4 MG TABS, Take 4 mg by mouth daily., Disp: 30 tablet, Rfl: 11   sulfamethoxazole-trimethoprim (BACTRIM DS) 800-160 MG tablet, Take 1 tablet by mouth daily., Disp: 30 tablet, Rfl: 11    Review of Systems  Constitutional:  Negative for activity change, appetite change, chills, diaphoresis, fatigue, fever and unexpected weight change.  HENT:  Negative for congestion, rhinorrhea, sinus pressure, sneezing, sore throat and trouble swallowing.   Eyes:  Negative for photophobia and visual disturbance.  Respiratory:  Negative for cough, chest tightness, shortness of breath, wheezing and stridor.   Cardiovascular:  Negative for chest pain, palpitations and leg swelling.  Gastrointestinal:  Negative for abdominal distention, abdominal pain, anal bleeding, blood in stool, constipation, diarrhea, nausea and vomiting.  Genitourinary:  Negative for difficulty urinating, dysuria, flank pain and hematuria.  Musculoskeletal:  Negative for arthralgias, back pain, gait problem, joint swelling and myalgias.  Skin:  Negative for color change, pallor, rash and wound.  Neurological:  Negative for dizziness, tremors, weakness and light-headedness.  Hematological:  Negative for adenopathy. Does not bruise/bleed easily.  Psychiatric/Behavioral:  Negative for agitation, behavioral problems, confusion, decreased concentration, dysphoric mood and sleep  disturbance.        Objective:   Physical Exam Constitutional:      Appearance: He is well-developed.  HENT:     Head: Normocephalic and atraumatic.  Eyes:     Conjunctiva/sclera: Conjunctivae normal.  Cardiovascular:     Rate and Rhythm: Normal rate and regular rhythm.  Pulmonary:     Effort: Pulmonary effort is normal. No respiratory distress.     Breath sounds: No wheezing.  Abdominal:     General: There is no distension.     Palpations: Abdomen is soft.  Musculoskeletal:        General: No tenderness. Normal range of motion.     Cervical back: Normal range of motion and neck supple.  Skin:    General: Skin is warm and dry.     Coloration: Skin is not pale.     Findings: No erythema or rash.  Neurological:     General: No focal deficit present.     Mental Status: He is alert and oriented to person, place, and time.  Psychiatric:        Mood and Affect: Mood normal.        Behavior: Behavior normal.        Thought Content: Thought content normal.        Judgment: Judgment normal.           Assessment & Plan:   Assessment and Plan    HIV infection HIV with low CD4 count.   --checking HIV RNA with reflex to genotype. He has R to RPV so would not be eligible for CROWN but might be for new ACTG study of CAB + LEN. - Prescribe Symtuza once daily. - Prescribe Bactrim once daily. --consider adding Rukobia - Educated on medication adherence and provided hospital operator contact for emergencies.  Hyperlipidemia Requires cholesterol management. - Prescribe Lipitor.  Vaccinations Due for flu, pneumonia, and COVID-19 vaccinations. Willing to receive flu shot at next appointment. - Administer flu shot and prevnar 20.

## 2023-11-04 NOTE — Addendum Note (Signed)
 Addended by: Juanita Laster on: 11/04/2023 02:43 PM   Modules accepted: Orders

## 2023-11-05 LAB — T-HELPER CELLS (CD4) COUNT (NOT AT ARMC)
CD4 % Helper T Cell: 6 % — ABNORMAL LOW (ref 33–65)
CD4 T Cell Abs: 35 /uL — ABNORMAL LOW (ref 400–1790)

## 2023-11-05 LAB — URINE CYTOLOGY ANCILLARY ONLY
Chlamydia: NEGATIVE
Comment: NEGATIVE
Comment: NORMAL
Neisseria Gonorrhea: NEGATIVE

## 2023-11-13 LAB — COMPLETE METABOLIC PANEL WITHOUT GFR
AG Ratio: 1.1 (calc) (ref 1.0–2.5)
ALT: 25 U/L (ref 9–46)
AST: 32 U/L (ref 10–40)
Albumin: 4.4 g/dL (ref 3.6–5.1)
Alkaline phosphatase (APISO): 71 U/L (ref 36–130)
BUN: 9 mg/dL (ref 7–25)
CO2: 28 mmol/L (ref 20–32)
Calcium: 9.5 mg/dL (ref 8.6–10.3)
Chloride: 100 mmol/L (ref 98–110)
Creat: 0.75 mg/dL (ref 0.60–1.29)
Globulin: 3.9 g/dL — ABNORMAL HIGH (ref 1.9–3.7)
Glucose, Bld: 75 mg/dL (ref 65–99)
Potassium: 3.2 mmol/L — ABNORMAL LOW (ref 3.5–5.3)
Sodium: 138 mmol/L (ref 135–146)
Total Bilirubin: 0.7 mg/dL (ref 0.2–1.2)
Total Protein: 8.3 g/dL — ABNORMAL HIGH (ref 6.1–8.1)

## 2023-11-13 LAB — LIPID PANEL
Cholesterol: 197 mg/dL (ref <200)
HDL: 73 mg/dL (ref >=40)
LDL Cholesterol (Calc): 110 mg/dL — ABNORMAL HIGH
Non-HDL Cholesterol (Calc): 124 mg/dL (ref <130)
Total CHOL/HDL Ratio: 2.7 (calc) (ref <5.0)
Triglycerides: 53 mg/dL (ref <150)

## 2023-11-13 LAB — HIV RNA, RTPCR W/R GT (RTI, PI,INT)
HIV 1 RNA Quant: 293000 {copies}/mL — ABNORMAL HIGH
HIV-1 RNA Quant, Log: 5.47 {Log_copies}/mL — ABNORMAL HIGH

## 2023-11-13 LAB — CBC WITH DIFFERENTIAL/PLATELET
Absolute Lymphocytes: 673 {cells}/uL — ABNORMAL LOW (ref 850–3900)
Absolute Monocytes: 453 {cells}/uL (ref 200–950)
Basophils Absolute: 40 {cells}/uL (ref 0–200)
Basophils Relative: 1.3 %
Eosinophils Absolute: 316 {cells}/uL (ref 15–500)
Eosinophils Relative: 10.2 %
HCT: 45.6 % (ref 38.5–50.0)
Hemoglobin: 14.9 g/dL (ref 13.2–17.1)
MCH: 29.5 pg (ref 27.0–33.0)
MCHC: 32.7 g/dL (ref 32.0–36.0)
MCV: 90.3 fL (ref 80.0–100.0)
MPV: 10.2 fL (ref 7.5–12.5)
Monocytes Relative: 14.6 %
Neutro Abs: 1618 {cells}/uL (ref 1500–7800)
Neutrophils Relative %: 52.2 %
Platelets: 229 10*3/uL (ref 140–400)
RBC: 5.05 10*6/uL (ref 4.20–5.80)
RDW: 11.5 % (ref 11.0–15.0)
Total Lymphocyte: 21.7 %
WBC: 3.1 10*3/uL — ABNORMAL LOW (ref 3.8–10.8)

## 2023-11-13 LAB — HIV-1 INTEGRASE GENOTYPE

## 2023-11-13 LAB — RPR: RPR Ser Ql: NONREACTIVE

## 2023-11-13 LAB — HIV-1 GENOTYPE: HIV-1 Genotype: DETECTED — AB

## 2023-12-08 ENCOUNTER — Ambulatory Visit: Payer: Self-pay | Admitting: Infectious Disease

## 2023-12-09 ENCOUNTER — Other Ambulatory Visit: Payer: Self-pay

## 2023-12-09 ENCOUNTER — Ambulatory Visit: Payer: Self-pay | Admitting: Infectious Disease

## 2023-12-09 ENCOUNTER — Encounter: Payer: Self-pay | Admitting: Infectious Disease

## 2023-12-09 VITALS — BP 137/84 | HR 72 | Resp 16 | Ht 64.0 in | Wt 130.8 lb

## 2023-12-09 DIAGNOSIS — B20 Human immunodeficiency virus [HIV] disease: Secondary | ICD-10-CM

## 2023-12-09 DIAGNOSIS — E785 Hyperlipidemia, unspecified: Secondary | ICD-10-CM

## 2023-12-09 MED ORDER — SYMTUZA 800-150-200-10 MG PO TABS: 1.0000 | ORAL_TABLET | Freq: Every day | ORAL | 11 refills | Status: DC

## 2023-12-09 MED ORDER — SULFAMETHOXAZOLE-TRIMETHOPRIM 800-160 MG PO TABS: 1.0000 | ORAL_TABLET | Freq: Every day | ORAL | 11 refills | Status: DC

## 2023-12-09 MED ORDER — ATORVASTATIN CALCIUM 20 MG PO TABS: 20.0000 mg | ORAL_TABLET | Freq: Every day | ORAL | 11 refills | Status: DC

## 2023-12-09 NOTE — Progress Notes (Signed)
 Subjective:  Chief complaint: follow-up for HIV disease on medications   Patient ID: Juan Aguilar, male    DOB: [birth year], 48 y.o.   MRN: [id number]  HPI  Discussed the use of AI scribe software for clinical note transcription with the patient, who gave verbal consent to proceed.  History of Present Illness   Juan Aguilar with a history of HIV and hyperlipidemia, presents for a follow-up visit. He was last seen in March, at which time he was started on Symtuza  and Bactrim  for HIV and Lipitor for hyperlipidemia. At that time, his viral load was high at 293,000 copies and his CD4 count was low at 35. He reports adherence to his medication regimen since the last visit.       Past Medical History:  Diagnosis Date   AIDS (HCC) 01/11/2015   HIV infection (HCC)    Hyperlipidemia 06/04/2022   Noncompliance 01/11/2015   Sore throat 07/19/2020   Vaccine counseling 07/05/2021   Weight loss 07/19/2020    Past Surgical History:  Procedure Laterality Date   none      Family History  Problem Relation Age of Onset   Hypertension Father       Social History   Socioeconomic History   Marital status: Married    Spouse name: Not on file   Number of children: Not on file   Years of education: Not on file   Highest education level: Not on file  Occupational History   Not on file  Tobacco Use   Smoking status: Former    Current packs/day: 0.30    Average packs/day: 0.3 packs/day for 10.0 years (3.0 ttl pk-yrs)    Types: Cigarettes    Start date: 12/21/2013   Smokeless tobacco: Never   Tobacco comments:    quit  Substance and Sexual Activity   Alcohol use: No    Alcohol/week: 0.0 standard drinks of alcohol   Drug use: No   Sexual activity: Yes    Partners: Female    Comment: pt. given condoms 08/31/20  Other Topics Concern   Not on file  Social History Narrative   Not on file   Social Drivers of Health   Financial Resource Strain: Not on file  Food Insecurity: Not on file   Transportation Needs: Not on file  Physical Activity: Not on file  Stress: Not on file  Social Connections: Not on file    No Known Allergies   Current Outpatient Medications:    atorvastatin  (LIPITOR) 20 MG tablet, Take 1 tablet (20 mg total) by mouth daily., Disp: 30 tablet, Rfl: 11   Darunavir -Cobicistat -Emtricitabine -Tenofovir  Alafenamide (SYMTUZA ) 800-150-200-10 MG TABS, Take 1 tablet by mouth daily with breakfast., Disp: 30 tablet, Rfl: 11   sulfamethoxazole -trimethoprim  (BACTRIM  DS) 800-160 MG tablet, Take 1 tablet by mouth daily., Disp: 30 tablet, Rfl: 11   Review of Systems  Constitutional:  Negative for activity change, appetite change, chills, diaphoresis, fatigue, fever and unexpected weight change.  HENT:  Negative for congestion, rhinorrhea, sinus pressure, sneezing, sore throat and trouble swallowing.   Eyes:  Negative for photophobia and visual disturbance.  Respiratory:  Negative for cough, chest tightness, shortness of breath, wheezing and stridor.   Cardiovascular:  Negative for chest pain, palpitations and leg swelling.  Gastrointestinal:  Negative for abdominal distention, abdominal pain, anal bleeding, blood in stool, constipation, diarrhea, nausea and vomiting.  Genitourinary:  Negative for difficulty urinating, dysuria, flank pain and hematuria.  Musculoskeletal:  Negative for arthralgias, back pain, gait problem, joint  swelling and myalgias.  Skin:  Negative for color change, pallor, rash and wound.  Neurological:  Negative for dizziness, tremors, weakness and light-headedness.  Hematological:  Negative for adenopathy. Does not bruise/bleed easily.  Psychiatric/Behavioral:  Negative for agitation, behavioral problems, confusion, decreased concentration, dysphoric mood and sleep disturbance.        Objective:   Physical Exam Constitutional:      Appearance: He is well-developed.  HENT:     Head: Normocephalic and atraumatic.  Eyes:      Conjunctiva/sclera: Conjunctivae normal.  Cardiovascular:     Rate and Rhythm: Normal rate and regular rhythm.  Pulmonary:     Effort: Pulmonary effort is normal. No respiratory distress.     Breath sounds: No wheezing.  Abdominal:     General: There is no distension.     Palpations: Abdomen is soft.  Musculoskeletal:        General: No tenderness. Normal range of motion.     Cervical back: Normal range of motion and neck supple.  Skin:    General: Skin is warm and dry.     Coloration: Skin is not pale.     Findings: No erythema or rash.  Neurological:     General: No focal deficit present.     Mental Status: He is alert and oriented to person, place, and time.  Psychiatric:        Mood and Affect: Mood normal.        Behavior: Behavior normal.        Thought Content: Thought content normal.        Judgment: Judgment normal.           Assessment & Plan:   Assessment and Plan HIV/AIDS    HIV infection HIV infection with prior high viral load and low CD4 count. Currently on Symtuza  and Bactrim . Discussed potential CROWN study - Order blood tests for viral load and CD4 count. - Consider for CROWN --cotinue Symtuza  and Bactrim   Hyperlipidemia On Lipitor since March. No changes needed --check lipid panel again

## 2023-12-10 LAB — C. TRACHOMATIS/N. GONORRHOEAE RNA
C. trachomatis RNA, TMA: NOT DETECTED
N. gonorrhoeae RNA, TMA: NOT DETECTED

## 2023-12-12 ENCOUNTER — Telehealth: Payer: Self-pay | Admitting: Physician Assistant

## 2023-12-12 NOTE — Telephone Encounter (Signed)
 LVM to call [facility] back regarding CROWN trial, would like to schedule 12/16/23 at 0900.

## 2023-12-18 LAB — COMPLETE METABOLIC PANEL WITHOUT GFR
AG Ratio: 1.2 (calc) (ref 1.0–2.5)
ALT: 22 U/L (ref 9–46)
AST: 27 U/L (ref 10–40)
Albumin: 4.6 g/dL (ref 3.6–5.1)
Alkaline phosphatase (APISO): 65 U/L (ref 36–130)
BUN: 11 mg/dL (ref 7–25)
CO2: 26 mmol/L (ref 20–32)
Calcium: 9.5 mg/dL (ref 8.6–10.3)
Chloride: 99 mmol/L (ref 98–110)
Creat: 0.88 mg/dL (ref 0.60–1.29)
Globulin: 4 g/dL — ABNORMAL HIGH (ref 1.9–3.7)
Glucose, Bld: 82 mg/dL (ref 65–99)
Potassium: 4 mmol/L (ref 3.5–5.3)
Sodium: 135 mmol/L (ref 135–146)
Total Bilirubin: 0.9 mg/dL (ref 0.2–1.2)
Total Protein: 8.6 g/dL — ABNORMAL HIGH (ref 6.1–8.1)

## 2023-12-18 LAB — RPR: RPR Ser Ql: NONREACTIVE

## 2023-12-18 LAB — CBC WITH DIFFERENTIAL/PLATELET
Absolute Lymphocytes: 813 {cells}/uL — ABNORMAL LOW (ref 850–3900)
Absolute Monocytes: 288 {cells}/uL (ref 200–950)
Basophils Absolute: 30 {cells}/uL (ref 0–200)
Basophils Relative: 1 %
Eosinophils Absolute: 258 {cells}/uL (ref 15–500)
Eosinophils Relative: 8.6 %
HCT: 48 % (ref 38.5–50.0)
Hemoglobin: 15.4 g/dL (ref 13.2–17.1)
MCH: 29 pg (ref 27.0–33.0)
MCHC: 32.1 g/dL (ref 32.0–36.0)
MCV: 90.4 fL (ref 80.0–100.0)
MPV: 10.4 fL (ref 7.5–12.5)
Monocytes Relative: 9.6 %
Neutro Abs: 1611 {cells}/uL (ref 1500–7800)
Neutrophils Relative %: 53.7 %
Platelets: 252 10*3/uL (ref 140–400)
RBC: 5.31 10*6/uL (ref 4.20–5.80)
RDW: 11.2 % (ref 11.0–15.0)
Total Lymphocyte: 27.1 %
WBC: 3 10*3/uL — ABNORMAL LOW (ref 3.8–10.8)

## 2023-12-18 LAB — HIV-1 INTEGRASE GENOTYPE

## 2023-12-18 LAB — T-HELPER CELLS (CD4) COUNT (NOT AT ARMC)
Absolute CD4: 48 {cells}/uL — ABNORMAL LOW (ref 490–1740)
CD4 T Helper %: 5 % — ABNORMAL LOW (ref 30–61)
Total lymphocyte count: 910 {cells}/uL (ref 850–3900)

## 2023-12-18 LAB — HIV RNA, RTPCR W/R GT (RTI, PI,INT)
HIV 1 RNA Quant: 21600 {copies}/mL — ABNORMAL HIGH
HIV-1 RNA Quant, Log: 4.33 {Log_copies}/mL — ABNORMAL HIGH

## 2023-12-18 LAB — HIV-1 GENOTYPE: HIV-1 Genotype: DETECTED — AB

## 2024-01-05 NOTE — Progress Notes (Signed)
 The 10-year ASCVD risk score (Arnett DK, et al., 2019) is: 4.6%   Values used to calculate the score:     Age: 48 years     Sex: Male     Is Non-Hispanic African American: Yes     Diabetic: No     Tobacco smoker: No     Systolic Blood Pressure: 137 mmHg     Is BP treated: No     HDL Cholesterol: 73 mg/dL     Total Cholesterol: 197 mg/dL  Currently prescribed atorvastatin  20 mg.   Juan Aguilar, BSN, RN

## 2024-01-13 ENCOUNTER — Other Ambulatory Visit: Payer: Self-pay

## 2024-01-13 ENCOUNTER — Encounter: Payer: Self-pay | Admitting: Infectious Disease

## 2024-01-13 ENCOUNTER — Ambulatory Visit (INDEPENDENT_AMBULATORY_CARE_PROVIDER_SITE_OTHER): Payer: Self-pay | Admitting: Infectious Disease

## 2024-01-13 VITALS — BP 92/63 | HR 76 | Temp 97.8°F | Wt 135.0 lb

## 2024-01-13 DIAGNOSIS — B20 Human immunodeficiency virus [HIV] disease: Secondary | ICD-10-CM

## 2024-01-13 DIAGNOSIS — E782 Mixed hyperlipidemia: Secondary | ICD-10-CM

## 2024-01-13 DIAGNOSIS — Z23 Encounter for immunization: Secondary | ICD-10-CM

## 2024-01-13 DIAGNOSIS — R634 Abnormal weight loss: Secondary | ICD-10-CM

## 2024-01-13 DIAGNOSIS — E785 Hyperlipidemia, unspecified: Secondary | ICD-10-CM

## 2024-01-13 MED ORDER — SYMTUZA 800-150-200-10 MG PO TABS: 1.0000 | ORAL_TABLET | Freq: Every day | ORAL | 11 refills | Status: DC

## 2024-01-13 MED ORDER — ATORVASTATIN CALCIUM 20 MG PO TABS: 20.0000 mg | ORAL_TABLET | Freq: Every day | ORAL | 11 refills | Status: DC

## 2024-01-13 MED ORDER — SULFAMETHOXAZOLE-TRIMETHOPRIM 800-160 MG PO TABS: 1.0000 | ORAL_TABLET | Freq: Every day | ORAL | 11 refills | Status: DC

## 2024-01-13 NOTE — Progress Notes (Signed)
 Subjective:  Chief complaint: follow-up for HIV disease on medications   Patient ID: Juan Aguilar, male    DOB: [birth year], 48 y.o.   MRN: [id number]  HPI  Discussed the use of AI scribe software for clinical note transcription with the patient, who gave verbal consent to proceed.  History of Present Illness   Juan Aguilar is a 48 year old male with HIV who presents for follow-up regarding his viral load and medication adherence.  He was initially seen in March 2025 and started on Symtuza  and Bactrim  due to a high viral load from being off medications. In April 2025, his viral load decreased to 21,000 copies from 200,000 copies after resuming medications. His CD4 count was 48, indicating significant immunosuppression. He is currently taking his medications regularly. He receives his medications from [facility] in [place]. He is originally from [place] and is in the process of obtaining a green card in the [place] .       Past Medical History:  Diagnosis Date   AIDS (HCC) 01/11/2015   HIV infection (HCC)    Hyperlipidemia 06/04/2022   Noncompliance 01/11/2015   Sore throat 07/19/2020   Vaccine counseling 07/05/2021   Weight loss 07/19/2020    Past Surgical History:  Procedure Laterality Date   none      Family History  Problem Relation Age of Onset   Hypertension Father       Social History   Socioeconomic History   Marital status: Married    Spouse name: Not on file   Number of children: Not on file   Years of education: Not on file   Highest education level: Not on file  Occupational History   Not on file  Tobacco Use   Smoking status: Former    Current packs/day: 0.30    Average packs/day: 0.3 packs/day for 10.1 years (3.0 ttl pk-yrs)    Types: Cigarettes    Start date: 12/21/2013   Smokeless tobacco: Never   Tobacco comments:    quit  Substance and Sexual Activity   Alcohol use: No    Alcohol/week: 0.0 standard drinks of alcohol   Drug use: No   Sexual  activity: Yes    Partners: Female    Comment: pt. given condoms 08/31/20  Other Topics Concern   Not on file  Social History Narrative   Not on file   Social Drivers of Health   Financial Resource Strain: Not on file  Food Insecurity: Not on file  Transportation Needs: Not on file  Physical Activity: Not on file  Stress: Not on file  Social Connections: Not on file    No Known Allergies   Current Outpatient Medications:    atorvastatin  (LIPITOR) 20 MG tablet, Take 1 tablet (20 mg total) by mouth daily., Disp: 30 tablet, Rfl: 11   Darunavir -Cobicistat -Emtricitabine -Tenofovir  Alafenamide (SYMTUZA ) 800-150-200-10 MG TABS, Take 1 tablet by mouth daily with breakfast., Disp: 30 tablet, Rfl: 11   sulfamethoxazole -trimethoprim  (BACTRIM  DS) 800-160 MG tablet, Take 1 tablet by mouth daily., Disp: 30 tablet, Rfl: 11   Review of Systems  Constitutional:  Negative for activity change, appetite change, chills, diaphoresis, fatigue, fever and unexpected weight change.  HENT:  Negative for congestion, rhinorrhea, sinus pressure, sneezing, sore throat and trouble swallowing.   Eyes:  Negative for photophobia and visual disturbance.  Respiratory:  Negative for cough, chest tightness, shortness of breath, wheezing and stridor.   Cardiovascular:  Negative for chest pain, palpitations and leg swelling.  Gastrointestinal:  Negative  for abdominal distention, abdominal pain, anal bleeding, blood in stool, constipation, diarrhea, nausea and vomiting.  Genitourinary:  Negative for difficulty urinating, dysuria, flank pain and hematuria.  Musculoskeletal:  Negative for arthralgias, back pain, gait problem, joint swelling and myalgias.  Skin:  Negative for color change, pallor, rash and wound.  Neurological:  Negative for dizziness, tremors, weakness and light-headedness.  Hematological:  Negative for adenopathy. Does not bruise/bleed easily.  Psychiatric/Behavioral:  Negative for agitation, behavioral  problems, confusion, decreased concentration, dysphoric mood and sleep disturbance.        Objective:   Physical Exam Constitutional:      Appearance: He is well-developed.  HENT:     Head: Normocephalic and atraumatic.  Eyes:     Conjunctiva/sclera: Conjunctivae normal.  Cardiovascular:     Rate and Rhythm: Normal rate and regular rhythm.  Pulmonary:     Effort: Pulmonary effort is normal. No respiratory distress.     Breath sounds: No wheezing.  Abdominal:     General: There is no distension.     Palpations: Abdomen is soft.  Musculoskeletal:        General: No tenderness. Normal range of motion.     Cervical back: Normal range of motion and neck supple.  Skin:    General: Skin is warm and dry.     Coloration: Skin is not pale.     Findings: No erythema or rash.  Neurological:     General: No focal deficit present.     Mental Status: He is alert and oriented to person, place, and time.  Psychiatric:        Mood and Affect: Mood normal.        Behavior: Behavior normal.        Thought Content: Thought content normal.        Judgment: Judgment normal.           Assessment & Plan:   Assessment and Plan    HIV disease HIV viral load decreased significantly with resumed Symtuza  and Bactrim . CD4 count remains low. Discussed potential research study enrollment if viral load exceeds 1,000 copies. - Order blood work for viral load and CD4 count. - Discuss research study (CROWN) enrollment if viral load not suppressed. - Continue Symtuza  and Bactrim . - Send prescriptions to [facility] in [place].   Hyperlipidemia: continue atorvastatin    Vaccine counseling: recommended dtap which he received

## 2024-01-15 ENCOUNTER — Ambulatory Visit: Payer: Self-pay

## 2024-01-15 LAB — COMPLETE METABOLIC PANEL WITHOUT GFR
AG Ratio: 1.3 (calc) (ref 1.0–2.5)
ALT: 16 U/L (ref 9–46)
AST: 19 U/L (ref 10–40)
Albumin: 4.4 g/dL (ref 3.6–5.1)
Alkaline phosphatase (APISO): 58 U/L (ref 36–130)
BUN: 13 mg/dL (ref 7–25)
CO2: 26 mmol/L (ref 20–32)
Calcium: 9.1 mg/dL (ref 8.6–10.3)
Chloride: 103 mmol/L (ref 98–110)
Creat: 0.78 mg/dL (ref 0.60–1.29)
Globulin: 3.5 g/dL (ref 1.9–3.7)
Glucose, Bld: 85 mg/dL (ref 65–99)
Potassium: 3.8 mmol/L (ref 3.5–5.3)
Sodium: 136 mmol/L (ref 135–146)
Total Bilirubin: 0.5 mg/dL (ref 0.2–1.2)
Total Protein: 7.9 g/dL (ref 6.1–8.1)

## 2024-01-15 LAB — T-HELPER CELLS (CD4) COUNT (NOT AT ARMC)
Absolute CD4: 67 {cells}/uL — ABNORMAL LOW (ref 490–1740)
CD4 T Helper %: 6 % — ABNORMAL LOW (ref 30–61)
Total lymphocyte count: 1135 {cells}/uL (ref 850–3900)

## 2024-01-15 LAB — CBC WITH DIFFERENTIAL/PLATELET
Absolute Lymphocytes: 1142 {cells}/uL (ref 850–3900)
Absolute Monocytes: 470 {cells}/uL (ref 200–950)
Basophils Absolute: 29 {cells}/uL (ref 0–200)
Basophils Relative: 0.6 %
Eosinophils Absolute: 289 {cells}/uL (ref 15–500)
Eosinophils Relative: 5.9 %
HCT: 43.7 % (ref 38.5–50.0)
Hemoglobin: 14 g/dL (ref 13.2–17.1)
MCH: 29.1 pg (ref 27.0–33.0)
MCHC: 32 g/dL (ref 32.0–36.0)
MCV: 90.9 fL (ref 80.0–100.0)
MPV: 10.4 fL (ref 7.5–12.5)
Monocytes Relative: 9.6 %
Neutro Abs: 2969 {cells}/uL (ref 1500–7800)
Neutrophils Relative %: 60.6 %
Platelets: 225 10*3/uL (ref 140–400)
RBC: 4.81 10*6/uL (ref 4.20–5.80)
RDW: 11.6 % (ref 11.0–15.0)
Total Lymphocyte: 23.3 %
WBC: 4.9 10*3/uL (ref 3.8–10.8)

## 2024-01-15 LAB — LIPID PANEL
Cholesterol: 182 mg/dL (ref <200)
HDL: 72 mg/dL (ref >=40)
LDL Cholesterol (Calc): 95 mg/dL
Non-HDL Cholesterol (Calc): 110 mg/dL (ref <130)
Total CHOL/HDL Ratio: 2.5 (calc) (ref <5.0)
Triglycerides: 63 mg/dL (ref <150)

## 2024-01-15 LAB — HIV-1 RNA QUANT-NO REFLEX-BLD
HIV 1 RNA Quant: 726 {copies}/mL — ABNORMAL HIGH
HIV-1 RNA Quant, Log: 2.86 {Log_copies}/mL — ABNORMAL HIGH

## 2024-01-15 LAB — RPR: RPR Ser Ql: NONREACTIVE

## 2024-01-15 NOTE — Telephone Encounter (Signed)
 Called patient to review results. Is currently at work and will call back during lunch break.  Julien Odor, RMA

## 2024-01-15 NOTE — Telephone Encounter (Signed)
-----   Message from [facility] sent at 01/15/2024 11:49 AM EDT ----- Regarding: FW: Virus becoming suppressed. While he's not eligible for crown study good news is his virus is coming under control and he needs to keep this up ----- Message ----- From: Interface, [facility] Results In Sent: 01/13/2024  10:39 PM EDT To: Charolette Copier, MD

## 2024-03-08 ENCOUNTER — Ambulatory Visit: Payer: Self-pay | Admitting: Infectious Disease

## 2024-03-08 NOTE — Progress Notes (Deleted)
   Chief complaint: follow-up for HIV disease on medications  Subjective:    Patient ID: Juan Aguilar, male    DOB: [birth year], 48 y.o.   MRN: [id number]  HPI  Past Medical History:  Diagnosis Date   AIDS (HCC) 01/11/2015   HIV infection (HCC)    Hyperlipidemia 06/04/2022   Noncompliance 01/11/2015   Sore throat 07/19/2020   Vaccine counseling 07/05/2021   Weight loss 07/19/2020    Past Surgical History:  Procedure Laterality Date   none      Family History  Problem Relation Age of Onset   Hypertension Father       Social History   Socioeconomic History   Marital status: Married    Spouse name: Not on file   Number of children: Not on file   Years of education: Not on file   Highest education level: Not on file  Occupational History   Not on file  Tobacco Use   Smoking status: Former    Current packs/day: 0.30    Average packs/day: 0.3 packs/day for 10.2 years (3.1 ttl pk-yrs)    Types: Cigarettes    Start date: 12/21/2013   Smokeless tobacco: Never   Tobacco comments:    quit  Substance and Sexual Activity   Alcohol use: No    Alcohol/week: 0.0 standard drinks of alcohol   Drug use: No   Sexual activity: Yes    Partners: Female    Comment: pt. given condoms 08/31/20  Other Topics Concern   Not on file  Social History Narrative   Not on file   Social Drivers of Health   Financial Resource Strain: Not on file  Food Insecurity: Not on file  Transportation Needs: Not on file  Physical Activity: Not on file  Stress: Not on file  Social Connections: Not on file    No Known Allergies   Current Outpatient Medications:    atorvastatin  (LIPITOR) 20 MG tablet, Take 1 tablet (20 mg total) by mouth daily., Disp: 30 tablet, Rfl: 11   Darunavir -Cobicistat -Emtricitabine -Tenofovir  Alafenamide (SYMTUZA ) 800-150-200-10 MG TABS, Take 1 tablet by mouth daily with breakfast., Disp: 30 tablet, Rfl: 11   sulfamethoxazole -trimethoprim  (BACTRIM  DS) 800-160 MG tablet, Take  1 tablet by mouth daily., Disp: 30 tablet, Rfl: 11   Review of Systems     Objective:   Physical Exam        Assessment & Plan:

## 2024-03-09 ENCOUNTER — Ambulatory Visit: Payer: Self-pay | Admitting: Infectious Disease

## 2024-03-09 ENCOUNTER — Telehealth: Payer: Self-pay

## 2024-03-09 DIAGNOSIS — B20 Human immunodeficiency virus [HIV] disease: Secondary | ICD-10-CM

## 2024-03-09 DIAGNOSIS — E782 Mixed hyperlipidemia: Secondary | ICD-10-CM

## 2024-03-09 DIAGNOSIS — Z7185 Encounter for immunization safety counseling: Secondary | ICD-10-CM

## 2024-03-09 DIAGNOSIS — Z91199 Patient's noncompliance with other medical treatment and regimen due to unspecified reason: Secondary | ICD-10-CM

## 2024-03-09 NOTE — Telephone Encounter (Signed)
 Detectable Viral Load Intervention (DVL)  Most recent VL:  HIV 1 RNA Quant  Date Value Ref Range Status  01/13/2024 726 (H) NOT DETECTED copies/mL Final  12/09/2023 21,600 (H) copies/mL Final  11/04/2023 293,000 (H) copies/mL Final    Last Clinic Visit: 01/13/24  Current ART regimen: Symtuza   Appointment status: patient does not have future appointment scheduled   Medication last dispensed (per chart review):   Medication Adherence   Not able to assess    Barriers to Care   Not able to assess    Interventions   Called patient to discuss medication adherence and possible barriers to care. Attempted to reach, call did not go through. Will mail letter requesting he reschedule missed appt.

## 2024-04-14 ENCOUNTER — Ambulatory Visit: Payer: Self-pay

## 2024-04-14 ENCOUNTER — Other Ambulatory Visit: Payer: Self-pay

## 2024-05-16 NOTE — Progress Notes (Unsigned)
   Subjective:   Chief complaint: follow-up for HIV disease on medications   Patient ID: Juan Aguilar, male    DOB: [birth year], 48 y.o.   MRN: [id number]  HPI   Past Medical History:  Diagnosis Date   AIDS (HCC) 01/11/2015   HIV infection (HCC)    Hyperlipidemia 06/04/2022   Noncompliance 01/11/2015   Sore throat 07/19/2020   Vaccine counseling 07/05/2021   Weight loss 07/19/2020    Past Surgical History:  Procedure Laterality Date   none      Family History  Problem Relation Age of Onset   Hypertension Father       Social History   Socioeconomic History   Marital status: Married    Spouse name: Not on file   Number of children: Not on file   Years of education: Not on file   Highest education level: Not on file  Occupational History   Not on file  Tobacco Use   Smoking status: Former    Current packs/day: 0.30    Average packs/day: 0.3 packs/day for 10.4 years (3.1 ttl pk-yrs)    Types: Cigarettes    Start date: 12/21/2013   Smokeless tobacco: Never   Tobacco comments:    quit  Substance and Sexual Activity   Alcohol use: No    Alcohol/week: 0.0 standard drinks of alcohol   Drug use: No   Sexual activity: Yes    Partners: Female    Comment: pt. given condoms 08/31/20  Other Topics Concern   Not on file  Social History Narrative   Not on file   Social Drivers of Health   Financial Resource Strain: Not on file  Food Insecurity: Not on file  Transportation Needs: Not on file  Physical Activity: Not on file  Stress: Not on file  Social Connections: Not on file    No Known Allergies   Current Outpatient Medications:    atorvastatin  (LIPITOR) 20 MG tablet, Take 1 tablet (20 mg total) by mouth daily., Disp: 30 tablet, Rfl: 11   Darunavir -Cobicistat -Emtricitabine -Tenofovir  Alafenamide (SYMTUZA ) 800-150-200-10 MG TABS, Take 1 tablet by mouth daily with breakfast., Disp: 30 tablet, Rfl: 11   sulfamethoxazole -trimethoprim  (BACTRIM  DS) 800-160 MG tablet,  Take 1 tablet by mouth daily., Disp: 30 tablet, Rfl: 11    Review of Systems     Objective:   Physical Exam        Assessment & Plan:

## 2024-05-17 ENCOUNTER — Other Ambulatory Visit: Payer: Self-pay

## 2024-05-17 ENCOUNTER — Ambulatory Visit (INDEPENDENT_AMBULATORY_CARE_PROVIDER_SITE_OTHER): Payer: Self-pay | Admitting: Infectious Disease

## 2024-05-17 DIAGNOSIS — Z91199 Patient's noncompliance with other medical treatment and regimen due to unspecified reason: Secondary | ICD-10-CM

## 2024-05-17 DIAGNOSIS — E782 Mixed hyperlipidemia: Secondary | ICD-10-CM

## 2024-05-17 DIAGNOSIS — B20 Human immunodeficiency virus [HIV] disease: Secondary | ICD-10-CM

## 2024-05-17 DIAGNOSIS — Z7185 Encounter for immunization safety counseling: Secondary | ICD-10-CM

## 2024-05-17 NOTE — Addendum Note (Signed)
 Addended by: GRETEL TULLY HERO on: 05/17/2024 09:59 AM   Modules accepted: Orders

## 2024-05-18 LAB — T-HELPER CELLS (CD4) COUNT (NOT AT ARMC)
CD4 % Helper T Cell: 4 % — ABNORMAL LOW (ref 33–65)
CD4 T Cell Abs: <35 /uL — ABNORMAL LOW (ref 400–1790)

## 2024-05-20 ENCOUNTER — Ambulatory Visit: Payer: Self-pay

## 2024-05-20 ENCOUNTER — Other Ambulatory Visit: Payer: Self-pay | Admitting: Infectious Disease

## 2024-05-20 LAB — CBC WITH DIFFERENTIAL/PLATELET
Absolute Lymphocytes: 840 {cells}/uL — ABNORMAL LOW (ref 850–3900)
Absolute Monocytes: 537 {cells}/uL (ref 200–950)
Basophils Absolute: 19 {cells}/uL (ref 0–200)
Basophils Relative: 0.5 %
Eosinophils Absolute: 414 {cells}/uL (ref 15–500)
Eosinophils Relative: 11.2 %
HCT: 42.7 % (ref 38.5–50.0)
Hemoglobin: 13.9 g/dL (ref 13.2–17.1)
MCH: 30.7 pg (ref 27.0–33.0)
MCHC: 32.6 g/dL (ref 32.0–36.0)
MCV: 94.3 fL (ref 80.0–100.0)
MPV: 10.5 fL (ref 7.5–12.5)
Monocytes Relative: 14.5 %
Neutro Abs: 1891 {cells}/uL (ref 1500–7800)
Neutrophils Relative %: 51.1 %
Platelets: 220 Thousand/uL (ref 140–400)
RBC: 4.53 Million/uL (ref 4.20–5.80)
RDW: 11.5 % (ref 11.0–15.0)
Total Lymphocyte: 22.7 %
WBC: 3.7 Thousand/uL — ABNORMAL LOW (ref 3.8–10.8)

## 2024-05-20 LAB — COMPLETE METABOLIC PANEL WITHOUT GFR
AG Ratio: 1.2 (calc) (ref 1.0–2.5)
ALT: 38 U/L (ref 9–46)
AST: 41 U/L — ABNORMAL HIGH (ref 10–40)
Albumin: 4.5 g/dL (ref 3.6–5.1)
Alkaline phosphatase (APISO): 70 U/L (ref 36–130)
BUN: 12 mg/dL (ref 7–25)
CO2: 22 mmol/L (ref 20–32)
Calcium: 8.7 mg/dL (ref 8.6–10.3)
Chloride: 103 mmol/L (ref 98–110)
Creat: 0.76 mg/dL (ref 0.60–1.29)
Globulin: 3.7 g/dL (ref 1.9–3.7)
Glucose, Bld: 73 mg/dL (ref 65–99)
Potassium: 3.7 mmol/L (ref 3.5–5.3)
Sodium: 135 mmol/L (ref 135–146)
Total Bilirubin: 0.4 mg/dL (ref 0.2–1.2)
Total Protein: 8.2 g/dL — ABNORMAL HIGH (ref 6.1–8.1)

## 2024-05-20 LAB — RPR: RPR Ser Ql: NONREACTIVE

## 2024-05-20 LAB — HIV-1 RNA QUANT-NO REFLEX-BLD
HIV 1 RNA Quant: 377000 {copies}/mL — ABNORMAL HIGH
HIV-1 RNA Quant, Log: 5.58 {Log_copies}/mL — ABNORMAL HIGH

## 2024-05-20 MED ORDER — SYMTUZA 800-150-200-10 MG PO TABS: 1.0000 | ORAL_TABLET | Freq: Every day | ORAL | 11 refills | Status: AC

## 2024-05-20 MED ORDER — ATORVASTATIN CALCIUM 20 MG PO TABS: 20.0000 mg | ORAL_TABLET | Freq: Every day | ORAL | 11 refills | Status: AC

## 2024-05-20 MED ORDER — SULFAMETHOXAZOLE-TRIMETHOPRIM 800-160 MG PO TABS: 1.0000 | ORAL_TABLET | Freq: Every day | ORAL | 11 refills | Status: AC

## 2024-05-20 NOTE — Telephone Encounter (Signed)
 Spoke with [facility], it sounded like he said he has only been out of Symtuza  for 3-4 days and that he would pick it up tonight or tomorrow, but it was very difficult to hear him.   Encouraged him to resume Symtuza  and reminded him of appointment on 10/15.  Demarquis Osley, BSN, RN

## 2024-06-01 NOTE — Progress Notes (Deleted)
   Subjective:  Chief Complaint: follow-up for HIV disease having been quite viremic at last visit   Patient ID: Juan Aguilar, male    DOB: May 22, 1976, 48 y.o.   MRN: [id number]  HPI  Past Medical History:  Diagnosis Date   AIDS (HCC) 01/11/2015   HIV infection (HCC)    Hyperlipidemia 06/04/2022   Noncompliance 01/11/2015   Sore throat 07/19/2020   Vaccine counseling 07/05/2021   Weight loss 07/19/2020    Past Surgical History:  Procedure Laterality Date   none      Family History  Problem Relation Age of Onset   Hypertension Father       Social History   Socioeconomic History   Marital status: Married    Spouse name: Not on file   Number of children: Not on file   Years of education: Not on file   Highest education level: Not on file  Occupational History   Not on file  Tobacco Use   Smoking status: Former    Current packs/day: 0.30    Average packs/day: 0.3 packs/day for 10.4 years (3.1 ttl pk-yrs)    Types: Cigarettes    Start date: 12/21/2013   Smokeless tobacco: Never   Tobacco comments:    quit  Substance and Sexual Activity   Alcohol use: No    Alcohol/week: 0.0 standard drinks of alcohol   Drug use: No   Sexual activity: Yes    Partners: Female    Comment: pt. given condoms 08/31/20  Other Topics Concern   Not on file  Social History Narrative   Not on file   Social Drivers of Health   Financial Resource Strain: Not on file  Food Insecurity: Not on file  Transportation Needs: Not on file  Physical Activity: Not on file  Stress: Not on file  Social Connections: Not on file    No Known Allergies   Current Outpatient Medications:    atorvastatin  (LIPITOR) 20 MG tablet, Take 1 tablet (20 mg total) by mouth daily., Disp: 30 tablet, Rfl: 11   Darunavir -Cobicistat -Emtricitabine -Tenofovir  Alafenamide (SYMTUZA ) 800-150-200-10 MG TABS, Take 1 tablet by mouth daily with breakfast., Disp: 30 tablet, Rfl: 11   sulfamethoxazole -trimethoprim  (BACTRIM  DS)  800-160 MG tablet, Take 1 tablet by mouth daily., Disp: 30 tablet, Rfl: 11   Review of Systems     Objective:   Physical Exam        Assessment & Plan:

## 2024-06-02 ENCOUNTER — Ambulatory Visit: Payer: Self-pay | Admitting: Infectious Disease

## 2024-06-02 DIAGNOSIS — B20 Human immunodeficiency virus [HIV] disease: Secondary | ICD-10-CM

## 2024-06-02 DIAGNOSIS — Z7185 Encounter for immunization safety counseling: Secondary | ICD-10-CM

## 2024-06-02 DIAGNOSIS — E782 Mixed hyperlipidemia: Secondary | ICD-10-CM

## 2024-10-12 ENCOUNTER — Other Ambulatory Visit: Payer: Self-pay

## 2024-10-26 ENCOUNTER — Encounter: Payer: Self-pay | Admitting: Infectious Disease
